# Patient Record
Sex: Female | Born: 1940 | Race: White | Hispanic: No | Marital: Married | Smoking: Never smoker
Health system: Southern US, Community
[De-identification: ages and names within clinical notes are randomized; demographics above are authoritative.]

## PROBLEM LIST (undated history)

## (undated) DIAGNOSIS — C4490 Unspecified malignant neoplasm of skin, unspecified: Secondary | ICD-10-CM

## (undated) DIAGNOSIS — C50919 Malignant neoplasm of unspecified site of unspecified female breast: Secondary | ICD-10-CM

## (undated) DIAGNOSIS — E079 Disorder of thyroid, unspecified: Secondary | ICD-10-CM

## (undated) DIAGNOSIS — I499 Cardiac arrhythmia, unspecified: Secondary | ICD-10-CM

## (undated) DIAGNOSIS — M1990 Unspecified osteoarthritis, unspecified site: Secondary | ICD-10-CM

## (undated) DIAGNOSIS — E039 Hypothyroidism, unspecified: Secondary | ICD-10-CM

## (undated) DIAGNOSIS — N3281 Overactive bladder: Secondary | ICD-10-CM

## (undated) HISTORY — PX: COLONOSCOPY: SHX174

## (undated) HISTORY — PX: ABDOMINAL HYSTERECTOMY: SHX81

## (undated) HISTORY — DX: Overactive bladder: N32.81

## (undated) HISTORY — PX: APPENDECTOMY: SHX54

## (undated) HISTORY — DX: Disorder of thyroid, unspecified: E07.9

## (undated) HISTORY — DX: Malignant neoplasm of unspecified site of unspecified female breast: C50.919

## (undated) HISTORY — DX: Unspecified malignant neoplasm of skin, unspecified: C44.90

## (undated) HISTORY — PX: BREAST SURGERY: SHX581

## (undated) HISTORY — PX: BREAST EXCISIONAL BIOPSY: SUR124

## (undated) HISTORY — PX: CATARACT EXTRACTION W/ INTRAOCULAR LENS IMPLANT: SHX1309

---
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
## 1971-04-14 HISTORY — PX: ABDOMINAL HYSTERECTOMY: SHX81

## 2005-07-28 ENCOUNTER — Other Ambulatory Visit: Admission: RE | Admit: 2005-07-28 | Discharge: 2005-07-28 | Payer: Self-pay | Admitting: Obstetrics and Gynecology

## 2007-10-03 ENCOUNTER — Encounter: Admission: RE | Admit: 2007-10-03 | Discharge: 2007-10-03 | Payer: Self-pay | Admitting: Obstetrics & Gynecology

## 2010-05-04 ENCOUNTER — Encounter: Payer: Self-pay | Admitting: Obstetrics & Gynecology

## 2017-05-22 ENCOUNTER — Encounter: Payer: Self-pay | Admitting: Emergency Medicine

## 2017-05-22 ENCOUNTER — Emergency Department
Admission: EM | Admit: 2017-05-22 | Discharge: 2017-05-22 | Disposition: A | Discharge status: Home / Self Care | Payer: Medicare HMO | Attending: Emergency Medicine | Admitting: Emergency Medicine

## 2017-05-22 ENCOUNTER — Emergency Department: Payer: Medicare HMO

## 2017-05-22 ENCOUNTER — Other Ambulatory Visit: Payer: Self-pay

## 2017-05-22 DIAGNOSIS — M5126 Other intervertebral disc displacement, lumbar region: Secondary | ICD-10-CM | POA: Diagnosis not present

## 2017-05-22 DIAGNOSIS — R319 Hematuria, unspecified: Secondary | ICD-10-CM | POA: Diagnosis not present

## 2017-05-22 DIAGNOSIS — M545 Low back pain, unspecified: Secondary | ICD-10-CM

## 2017-05-22 DIAGNOSIS — R1032 Left lower quadrant pain: Secondary | ICD-10-CM | POA: Diagnosis present

## 2017-05-22 LAB — URINALYSIS, COMPLETE (UACMP) WITH MICROSCOPIC
BILIRUBIN URINE: NEGATIVE
Bacteria, UA: NONE SEEN
Glucose, UA: NEGATIVE mg/dL
Hgb urine dipstick: NEGATIVE
KETONES UR: NEGATIVE mg/dL
LEUKOCYTES UA: NEGATIVE
NITRITE: NEGATIVE
PH: 6 (ref 5.0–8.0)
Protein, ur: NEGATIVE mg/dL
SPECIFIC GRAVITY, URINE: 1.019 (ref 1.005–1.030)

## 2017-05-22 MED ORDER — HYDROCODONE-ACETAMINOPHEN 5-325 MG PO TABS
1.0000 | ORAL_TABLET | Freq: Once | ORAL | Status: AC
  Administered 2017-05-22: 1 via ORAL
  Filled 2017-05-22: qty 1

## 2017-05-22 MED ORDER — HYDROCODONE-ACETAMINOPHEN 5-325 MG PO TABS: ORAL_TABLET | ORAL | 0 refills | Status: DC

## 2017-05-22 NOTE — Discharge Instructions (Signed)
Follow-up with Dr. Joice LoftsPoggi who is in the orthopedic department of [facility].  You will need to call on Monday to make an appointment.  Take Norco every 6-8 hours as needed for pain.  Do not take this medication and plan to do a lot of walking as this could increase her risk for falling.  You may also use ice or heat to your back to see if this helps with symptomatic back pain. No lifting, pushing, pulling until you are pain-free.

## 2017-05-22 NOTE — ED Triage Notes (Addendum)
Pt to ed with c/o back pain x 2 weeks.  States she has been helping her mother move.  Denies fall,  Reports pain worse with movement and twisting.

## 2017-05-22 NOTE — ED Provider Notes (Signed)
[facility] Emergency Department Provider Note   ____________________________________________   First MD Initiated Contact with Patient 05/22/17 [phone]     (approximate)  I have reviewed the triage vital signs and the nursing notes.   HISTORY  Chief Complaint Back Pain   HPI Erica Donaldson is a 77 y.o. female is here with complaint of back pain.  Patient states that she has been having back pain for 2 weeks.  Patient states that she has been traveling to [place] to help her mother move and has been doing a lot of lifting.  Pain is increased with any bending or twisting.  She has been taking Tylenol infrequently.  She states that initially she went to the chiropractor who "adjusted her" and pain was relieved for a period of time.  She denies any paresthesias or radiation into her legs.  There is been no incontinence of bowel or bladder.  Patient is continued to ambulate without any assistance.  She denies any previous back problems.  She rates her pain as a 9/10.  History reviewed. No pertinent past medical history.  There are no active problems to display for this patient.   History reviewed. No pertinent surgical history.  Prior to Admission medications   Medication Sig Start Date End Date Taking? Authorizing Provider  HYDROcodone-acetaminophen (NORCO/VICODIN) 5-325 MG tablet 1 tablet every 6-8 hours as needed for pain. 05/22/17   Tommi Rumps, PA-C    Allergies Patient has no known allergies.  History reviewed. No pertinent family history.  Social History Social History   Tobacco Use  . Smoking status: Never Smoker  . Smokeless tobacco: Never Used  Substance Use Topics  . Alcohol use: No    Frequency: Never  . Drug use: No    Review of Systems Constitutional: No fever/chills Eyes: No visual changes. Cardiovascular: Denies chest pain. Respiratory: Denies shortness of breath. Gastrointestinal: No abdominal pain.  No nausea, no  vomiting.  Genitourinary: Negative for dysuria. Musculoskeletal: Positive for back pain. Skin: Negative for rash. Neurological: Negative for headaches, focal weakness or numbness. ____________________________________________   PHYSICAL EXAM:  VITAL SIGNS: ED Triage Vitals  Enc Vitals Group     BP 05/22/17 0914 127/80     Pulse Rate 05/22/17 0914 77     Resp 05/22/17 0914 18     Temp 05/22/17 0914 (!) 97.5 F (36.4 C)     Temp Source 05/22/17 0914 Oral     SpO2 05/22/17 0914 100 %     Weight 05/22/17 0915 131 lb (59.4 kg)     Height --      Head Circumference --      Peak Flow --      Pain Score 05/22/17 0915 9     Pain Loc --      Pain Edu? --      Excl. in GC? --    Constitutional: Alert and oriented. Well appearing and in no acute distress. Eyes: Conjunctivae are normal.  Head: Atraumatic. Nose: No congestion/rhinnorhea. Mouth/Throat: Mucous membranes are moist.  Oropharynx non-erythematous. Neck: No stridor.   Cardiovascular: Normal rate, regular rhythm. Grossly normal heart sounds.  Good peripheral circulation. Respiratory: Normal respiratory effort.  No retractions. Lungs CTAB. Gastrointestinal: Soft and nontender. No distention.  No CVA tenderness.  Musculoskeletal: Examination of the back there is no gross deformity.  There is tenderness to palpation in the soft tissues lumbar spine on the left and surrounding the SI joint on the left.  Nontender  to palpation on the right.  There is no point tenderness on palpation of the lumbar spine.  Range of motion is difficult and patient is unable to sit for periods of time due to pain.  Straight leg raises were negative. Neurologic:  Normal speech and language. No gross focal neurologic deficits are appreciated.  Reflexes were 1+ bilaterally.  No gait instability. Skin:  Skin is warm, dry and intact. No rash noted. Psychiatric: Mood and affect are normal. Speech and behavior are  normal.  ____________________________________________   LABS (all labs ordered are listed, but only abnormal results are displayed)  Labs Reviewed  URINALYSIS, COMPLETE (UACMP) WITH MICROSCOPIC - Abnormal; Notable for the following components:      Result Value   Color, Urine YELLOW (*)    APPearance CLEAR (*)    Squamous Epithelial / LPF 0-5 (*)    All other components within normal limits    RADIOLOGY  ED MD interpretation:   No compression fractures are appreciated on lumbar spine.  Official radiology report(s): Dg Lumbar Spine 2-3 Views  Result Date: 05/22/2017 CLINICAL DATA:  Low back pain for 2 weeks EXAM: LUMBAR SPINE - 2-3 VIEW COMPARISON:  None. FINDINGS: Normal alignment. No fracture. SI joints are symmetric and unremarkable. Slight disc space narrowing at L1-2. IMPRESSION: No acute bony abnormality. Electronically Signed   By: Charlett NoseKevin  Dover M.D.   On: 05/22/2017 10:30   Ct Renal Stone Study  Result Date: 05/22/2017 CLINICAL DATA:  Low back pain, flank pain EXAM: CT ABDOMEN AND PELVIS WITHOUT CONTRAST TECHNIQUE: Multidetector CT imaging of the abdomen and pelvis was performed following the standard protocol without IV contrast. COMPARISON:  None. FINDINGS: Lower chest: Lung bases are clear. No effusions. Heart is normal size. Hepatobiliary: No focal hepatic abnormality. Gallbladder unremarkable. Pancreas: No focal abnormality or ductal dilatation. Spleen: No focal abnormality.  Normal size. Adrenals/Urinary Tract: No adrenal abnormality. No focal renal abnormality. No stones or hydronephrosis. Urinary bladder is unremarkable. Stomach/Bowel: Moderate stool burden throughout the colon. Stomach, large and small bowel grossly unremarkable. Vascular/Lymphatic: No evidence of aortic aneurysm. There is haziness noted in the retroperitoneum posterior to the aorta anterior to the L1-2 and L2-3 disc spaces. This does not surround the aorta. No visible adenopathy. Reproductive: Prior  hysterectomy.  No adnexal masses. Other: No free fluid or free air. Musculoskeletal: No acute bony abnormality. On the lateral view, in the area of retroperitoneal stranding. There is suggestion of anterior disc herniations at L1-2 and L2-3. IMPRESSION: No renal or ureteral stones.  No hydronephrosis. Moderate stool burden. Haziness in the retroperitoneum posterior to the aorta and anterior to the upper to mid lumbar spine. On sagittal imaging, there is suggestion of possible anterior disc herniations at L1-2 and L2-3. This haziness in the retroperitoneum could reflect fibrosis or be related to inflammation from disc disease. Recommend further evaluation with lumbar spine MRI. Electronically Signed   By: Charlett NoseKevin  Dover M.D.   On: 05/22/2017 11:39    ____________________________________________   PROCEDURES  Procedure(s) performed: None  Procedures  Critical Care performed: No  ____________________________________________   INITIAL IMPRESSION / ASSESSMENT AND PLAN / ED COURSE ----------------------------------------- 12:00 PM on 05/22/2017 ----------------------------------------- Patient was given Norco while in the department and states that pain medication has helped greatly.  Patient has hematuria without symptoms.  She has not had any kidney stones in the past but has had a history of UTIs.  After a negative lumbar spine x-ray and hematuria present a CT renal study was  performed.  No stones were noted however patient did have most likely an anterior disc herniation at L1 and L2 and at L2-L3.  Patient is comfortable at this time and has no paresthesias.  Patient was discharged with a prescription for Norco as needed for pain.  She was also referred to Dr. Joice Lofts as there is no one on-call for neurosurgery today.  Patient is aware that she may be referred to a different doctor what she calls to make the appointment.  ____________________________________________   FINAL CLINICAL  IMPRESSION(S) / ED DIAGNOSES  Final diagnoses:  Herniation of lumbar intervertebral disc  Acute left-sided low back pain without sciatica  Hematuria, unspecified type     ED Discharge Orders        Ordered    HYDROcodone-acetaminophen (NORCO/VICODIN) 5-325 MG tablet     05/22/17 1204       Note:  This document was prepared using Dragon voice recognition software and may include unintentional dictation errors.    Tommi Rumps, PA-C 05/22/17 1258    Jeanmarie Plant, MD 05/22/17 [phone]

## 2017-05-22 NOTE — ED Notes (Signed)
Pt states about 2 weeks ago she began to feel pain in the middle of her lower back, went to the chiropractor and felt some better but now continued pain, Worse on the left side and into her hip, ambulatory without assistance

## 2018-05-02 ENCOUNTER — Encounter: Payer: Self-pay | Admitting: Obstetrics & Gynecology

## 2018-05-03 ENCOUNTER — Ambulatory Visit (INDEPENDENT_AMBULATORY_CARE_PROVIDER_SITE_OTHER): Payer: Medicare HMO | Admitting: Obstetrics & Gynecology

## 2018-05-03 ENCOUNTER — Encounter: Payer: Self-pay | Admitting: Obstetrics & Gynecology

## 2018-05-03 VITALS — BP 120/80 | Ht 63.0 in | Wt 132.0 lb

## 2018-05-03 DIAGNOSIS — Z90710 Acquired absence of both cervix and uterus: Secondary | ICD-10-CM

## 2018-05-03 DIAGNOSIS — N393 Stress incontinence (female) (male): Secondary | ICD-10-CM | POA: Insufficient documentation

## 2018-05-03 DIAGNOSIS — N9419 Other specified dyspareunia: Secondary | ICD-10-CM | POA: Insufficient documentation

## 2018-05-03 DIAGNOSIS — Z01419 Encounter for gynecological examination (general) (routine) without abnormal findings: Secondary | ICD-10-CM

## 2018-05-03 DIAGNOSIS — N952 Postmenopausal atrophic vaginitis: Secondary | ICD-10-CM | POA: Insufficient documentation

## 2018-05-03 MED ORDER — REPLENS VA GEL: 1.0000 | VAGINAL | 11 refills | Status: DC

## 2018-05-03 NOTE — Progress Notes (Signed)
HPI:      [name] is a 78 y.o. G1P0 who LMP was in the past, she presents today for her annual examination- [place] from [place], [place].  Remarried 2 years ago (widow before).  The patient has no complaints today. The patient is sexually active, and reports some BURNING DRYNESS AND DYSPAREUNIA AT TIMES. Herlast pap: approximate date (years ago, prior hysterectomy) and was normal and last mammogram: approximate date 04/2018 and was normal.  The patient does perform self breast exams.  There is no notable family history of breast or ovarian cancer in her family. The patient is taking hormone replacement therapy- BIOIDENTICAL ERT and TESTOSTERONE, for 20 YEARS. Patient denies post-menopausal vaginal bleeding.   The patient has regular exercise: yes. The patient denies current symptoms of depression.    Pt has LOU w stress activities, denies nocturia, urge, and frequency. Prior meds and Botox to bladder no help.  GYN Hx: Last Colonoscopy:10 years ago. Normal.  Last DEXA: never ago.    PMHx: Past Medical History:  Diagnosis Date  . Thyroid disease    Past Surgical History:  Procedure Laterality Date  . ABDOMINAL HYSTERECTOMY    . APPENDECTOMY    . BREAST SURGERY     Family History  Problem Relation Age of Onset  . Cancer Brother   . Lung cancer Brother   . Cancer Maternal Aunt   . Breast cancer Maternal Aunt   . Cancer Paternal Grandmother   . Uterine cancer Paternal Grandmother    Social History   Tobacco Use  . Smoking status: Never Smoker  . Smokeless tobacco: Never Used  Substance Use Topics  . Alcohol use: No    Frequency: Never  . Drug use: No    Current Outpatient Medications:  .  atorvastatin (LIPITOR) 10 MG tablet, , Disp: , Rfl:  .  levothyroxine (SYNTHROID) 25 MCG tablet, Take by mouth., Disp: , Rfl:  .  metoprolol tartrate (LOPRESSOR) 50 MG tablet, Take by mouth., Disp: , Rfl:  Allergies: Sulfa antibiotics  Review of Systems    Constitutional: Negative for chills, fever and malaise/fatigue.  HENT: Negative for congestion, sinus pain and sore throat.   Eyes: Negative for blurred vision and pain.  Respiratory: Negative for cough and wheezing.   Cardiovascular: Negative for chest pain and leg swelling.  Gastrointestinal: Negative for abdominal pain, constipation, diarrhea, heartburn, nausea and vomiting.  Genitourinary: Negative for dysuria, frequency, hematuria and urgency.  Musculoskeletal: Negative for back pain, joint pain, myalgias and neck pain.  Skin: Negative for itching and rash.  Neurological: Negative for dizziness, tremors and weakness.  Endo/Heme/Allergies: Does not bruise/bleed easily.  Psychiatric/Behavioral: Negative for depression. The patient is not nervous/anxious and does not have insomnia.    Objective: BP 120/80   Ht 5\' 3"  (1.6 m)   Wt 132 lb (59.9 kg)   BMI 23.38 kg/m   Filed Weights   05/03/18 1101  Weight: 132 lb (59.9 kg)   Body mass index is 23.38 kg/m. Physical Exam Constitutional:      General: She is not in acute distress.    Appearance: She is well-developed.  Genitourinary:     Pelvic exam was performed with patient supine.     Vagina and rectum normal.     No lesions in the vagina.     No vaginal bleeding.     No right or left adnexal mass present.     Right adnexa not tender.  Left adnexa not tender.     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 No laceration.     Right Ear: Hearing normal.     Left Ear: Hearing normal.     Mouth/Throat:     Pharynx: Uvula midline.  Eyes:     Pupils: Pupils are equal, round, and reactive to light.  Neck:     Musculoskeletal: Normal range of motion and neck supple.     Thyroid: No thyromegaly.  Cardiovascular:     Rate and Rhythm: Normal rate and regular rhythm.     Heart  sounds: No murmur. No friction rub. No gallop.   Pulmonary:     Effort: Pulmonary effort is normal. No respiratory distress.     Breath sounds: Normal breath sounds. No wheezing.  Chest:     Breasts:        Right: No mass, skin change or tenderness.        Left: No mass, skin change or tenderness.  Abdominal:     General: Bowel sounds are normal. There is no distension.     Palpations: Abdomen is soft.     Tenderness: There is no abdominal tenderness. There is no rebound.  Musculoskeletal: Normal range of motion.  Neurological:     Mental Status: She is alert and oriented to person, place, and time.     Cranial Nerves: No cranial nerve deficit.  Skin:    General: Skin is warm and dry.  Psychiatric:        Judgment: Judgment normal.  Vitals signs reviewed.   Assessment: Annual Exam 1. History of hysterectomy   2. Women's annual routine gynecological examination   3. 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 Vaginal atrophy   5. 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  Cervical Screening-  Pap smear not performed, no longer required  2. Breast screening- Exam annually and mammogram scheduled. UTD.  3. Colonoscopy every 10 years although she has been told by PCP she does not require any longer, Hemoccult testing after age 78  4. Labs managed by PCP  5. Counseling for hormonal therapy: no change in therapy today--- she desires to continue with Bio-identical ERT and testosterone.  Will instruct her on [facility] as option to continue compounding regimen.  6. Vaginal atrophy and dyspareunia.  Replens in addition to bio-identical hormones  7. Stress urinary incontincence.  Options for Ant repair (also post repair for mild rectocele) plus/minus sling for GSI discussed.  Pessary alternatives also discussed (she is sexually active and this may affect decision).  She will follow up w Urology as already has appt there in Feb 2020.  Info provided to  patient.     F/U  Return in about 1 year (around 05/04/2019) for Annual.  Annamarie MajorPaul Elizebeth Kluesner, MD, Merlinda FrederickFACOG [facility], [facility] 05/03/2018  11:25 AM

## 2018-05-03 NOTE — Patient Instructions (Addendum)
[facility]    [phone]    Delivery available    [address], [place], [place]  Atrophic Vaginitis  Atrophic vaginitis is a condition in which the tissues that line the vagina become dry and thin. This condition is most common in women who have stopped having regular menstrual periods (are in menopause). This usually starts when a woman is 4345-77 years old. That is the time when a woman's estrogen levels begin to drop (decrease). Estrogen is a female hormone. It helps to keep the tissues of the vagina moist. It stimulates the vagina to produce a clear fluid that lubricates the vagina for sexual intercourse. This fluid also protects the vagina from infection. Lack of estrogen can cause the lining of the vagina to get thinner and dryer. The vagina may also shrink in size. It may become less elastic. Atrophic vaginitis tends to get worse over time as a woman's estrogen level drops. What are the causes? This condition is caused by the normal drop in estrogen that happens around the time of menopause. What increases the risk? Certain conditions or situations may lower a woman's estrogen level, leading to a higher risk for atrophic vaginitis. You are more likely to develop this condition if:  You are taking medicines that block estrogen.  You have had your ovaries removed.  You are being treated for cancer with X-ray (radiation) or medicines (chemotherapy).  You have given birth or are breastfeeding.  You are older than age 77.  You smoke. What are the signs or symptoms? Symptoms of this condition include:  Pain, soreness, or bleeding during sexual intercourse (dyspareunia).  Vaginal burning, irritation, or itching.  Pain or bleeding when a speculum is used in a vaginal exam (pelvic exam).  Having burning pain when passing urine.  Vaginal discharge that is brown or yellow. In some cases, there are no symptoms. How is this diagnosed? This condition is diagnosed by taking a  medical history and doing a physical exam. This will include a pelvic exam that checks the vaginal tissues. Though rare, you may also have other tests, including:  A urine test.  A test that checks the acid balance in your vagina (acid balance test). How is this treated? Treatment for this condition depends on how severe your symptoms are. Treatment may include:  Using an over-the-counter vaginal lubricant before sex.  Using a long-acting vaginal moisturizer. - - - REPLENS twice weekly  Using low-dose vaginal estrogen for moderate to severe symptoms that do not respond to other treatments. Options include creams, tablets, and inserts (vaginal rings). Before you use a vaginal estrogen, tell your health care provider if you have a history of: ? Breast cancer. ? Endometrial cancer. ? Blood clots. If you are not sexually active and your symptoms are very mild, you may not need treatment. Follow these instructions at home: Medicines  Take over-the-counter and prescription medicines only as told by your health care provider. Do not use herbal or alternative medicines unless your health care provider says that you can.  Use over-the-counter creams, lubricants, or moisturizers for dryness only as directed by your health care provider. General instructions  If your atrophic vaginitis is caused by menopause, discuss all of your menopause symptoms and treatment options with your health care provider.  Do not douche.  Do not use products that can make your vagina dry. These include: ? Scented feminine sprays. ? Scented tampons. ? Scented soaps.  Vaginal intercourse can help to improve blood flow  and elasticity of vaginal tissue. If it hurts to have sex, try using a lubricant or moisturizer just before having intercourse. Contact a health care provider if:  Your discharge looks different than normal.  Your vagina has an unusual smell.  You have new symptoms.  Your symptoms do not  improve with treatment.  Your symptoms get worse. Summary  Atrophic vaginitis is a condition in which the tissues that line the vagina become dry and thin. It is most common in women who have stopped having regular menstrual periods (are in menopause).  Treatment options include using vaginal lubricants and low-dose vaginal estrogen.  Contact a health care provider if your vagina has an unusual smell, or if your symptoms get worse or do not improve after treatment. This information is not intended to replace advice given to you by your health care provider. Make sure you discuss any questions you have with your health care provider. Document Released: 08/14/2014 Document Revised: 12/24/2016 Document Reviewed: 12/24/2016 Elsevier Interactive Patient Education  2019 [employer].

## 2018-05-12 ENCOUNTER — Telehealth: Payer: Self-pay | Admitting: Obstetrics & Gynecology

## 2018-05-12 ENCOUNTER — Other Ambulatory Visit: Payer: Self-pay

## 2018-05-12 MED ORDER — REPLENS VA GEL: 1.0000 | VAGINAL | 11 refills | Status: DC

## 2018-05-12 NOTE — Telephone Encounter (Signed)
Pt came in stating new rx needs to be sent to [facility] like discussed at last appt. Thank you

## 2018-05-12 NOTE — Telephone Encounter (Signed)
Added pharmacy and sent Replens to new pharmacy

## 2018-11-17 ENCOUNTER — Other Ambulatory Visit: Payer: Self-pay | Admitting: Obstetrics & Gynecology

## 2019-02-28 ENCOUNTER — Other Ambulatory Visit: Payer: Self-pay | Admitting: Family Medicine

## 2019-02-28 DIAGNOSIS — R1011 Right upper quadrant pain: Secondary | ICD-10-CM

## 2019-03-06 ENCOUNTER — Ambulatory Visit
Admission: RE | Admit: 2019-03-06 | Discharge: 2019-03-06 | Disposition: A | Discharge status: Home / Self Care | Payer: Medicare HMO | Source: Ambulatory Visit | Attending: Family Medicine | Admitting: Family Medicine

## 2019-03-06 ENCOUNTER — Other Ambulatory Visit: Payer: Self-pay

## 2019-03-06 DIAGNOSIS — R1011 Right upper quadrant pain: Secondary | ICD-10-CM

## 2019-03-16 ENCOUNTER — Other Ambulatory Visit (HOSPITAL_COMMUNITY): Payer: Self-pay | Admitting: Gastroenterology

## 2019-03-16 ENCOUNTER — Other Ambulatory Visit: Payer: Self-pay | Admitting: Gastroenterology

## 2019-03-16 DIAGNOSIS — R110 Nausea: Secondary | ICD-10-CM

## 2019-03-16 DIAGNOSIS — R1011 Right upper quadrant pain: Secondary | ICD-10-CM

## 2019-03-16 DIAGNOSIS — R1013 Epigastric pain: Secondary | ICD-10-CM

## 2019-03-27 ENCOUNTER — Other Ambulatory Visit: Payer: Self-pay

## 2019-03-27 ENCOUNTER — Ambulatory Visit
Admission: RE | Admit: 2019-03-27 | Discharge: 2019-03-27 | Disposition: A | Discharge status: Home / Self Care | Payer: Medicare HMO | Source: Ambulatory Visit | Attending: Gastroenterology | Admitting: Gastroenterology

## 2019-03-27 DIAGNOSIS — R1013 Epigastric pain: Secondary | ICD-10-CM | POA: Diagnosis present

## 2019-03-27 DIAGNOSIS — R1011 Right upper quadrant pain: Secondary | ICD-10-CM | POA: Insufficient documentation

## 2019-03-27 DIAGNOSIS — R110 Nausea: Secondary | ICD-10-CM | POA: Diagnosis present

## 2019-03-27 MED ORDER — TECHNETIUM TC 99M MEBROFENIN IV KIT
5.3800 | PACK | Freq: Once | INTRAVENOUS | Status: AC | PRN
  Administered 2019-03-27: 5.38 via INTRAVENOUS

## 2019-04-24 ENCOUNTER — Other Ambulatory Visit: Payer: Self-pay | Admitting: Obstetrics & Gynecology

## 2019-04-24 ENCOUNTER — Telehealth: Payer: Self-pay

## 2019-04-24 DIAGNOSIS — Z1231 Encounter for screening mammogram for malignant neoplasm of breast: Secondary | ICD-10-CM

## 2019-04-24 DIAGNOSIS — Z1239 Encounter for other screening for malignant neoplasm of breast: Secondary | ICD-10-CM

## 2019-04-24 NOTE — Telephone Encounter (Signed)
MMG order placed, she can call to make appt

## 2019-04-24 NOTE — Telephone Encounter (Signed)
Pt was mistaken, she is wanting [place] to call a mammogram office in [place] and give them [facility] info so they can send over the results after her mammo. I will call and give info. They will send results over after her appt there 2/10

## 2019-04-24 NOTE — Telephone Encounter (Signed)
Pt calling triage today requesting order for her mammo so she can get it when she comes for her annual. She lives out of town so wants to get it all done the same day. Please advise. They will not let her schedule without an order since she has not been seen lately.

## 2019-05-08 ENCOUNTER — Encounter: Payer: Self-pay | Admitting: Obstetrics & Gynecology

## 2019-05-08 ENCOUNTER — Ambulatory Visit (INDEPENDENT_AMBULATORY_CARE_PROVIDER_SITE_OTHER): Payer: Medicare HMO | Admitting: Obstetrics & Gynecology

## 2019-05-08 ENCOUNTER — Other Ambulatory Visit: Payer: Self-pay

## 2019-05-08 VITALS — BP 120/80 | Ht 63.0 in | Wt 139.0 lb

## 2019-05-08 DIAGNOSIS — N3281 Overactive bladder: Secondary | ICD-10-CM | POA: Insufficient documentation

## 2019-05-08 DIAGNOSIS — Z01419 Encounter for gynecological examination (general) (routine) without abnormal findings: Secondary | ICD-10-CM | POA: Diagnosis not present

## 2019-05-08 DIAGNOSIS — N952 Postmenopausal atrophic vaginitis: Secondary | ICD-10-CM

## 2019-05-08 DIAGNOSIS — Z90710 Acquired absence of both cervix and uterus: Secondary | ICD-10-CM

## 2019-05-08 MED ORDER — REPLENS VA GEL: 1.0000 | VAGINAL | 11 refills | Status: DC

## 2019-05-08 NOTE — Progress Notes (Signed)
HPI:      [name] is a 79 y.o. G1P0 who LMP was in the past, she presents today for her annual examination.  The patient has complaints today of continued vaginal dryness that Replens helps with and also unresolved urinary concerns with incontinence especially related to frequency and urgency; she was unable to see urologist last year;  Has had prior testing, pills, and Botox without relief. The patient is not currently sexually active. Herlast pap: was normal and she is not due as has had prior hysterectomy and last mammogram: approximate date 2019 and was normal.  The patient does perform self breast exams.  There is no notable family history of breast or ovarian cancer in her family. The patient is taking hormone replacement therapy. Patient denies post-menopausal vaginal bleeding.   The patient has regular exercise: yes. The patient denies current symptoms of depression.      PMHx: Past Medical History:  Diagnosis Date  . Thyroid disease    Past Surgical History:  Procedure Laterality Date  . ABDOMINAL HYSTERECTOMY    . APPENDECTOMY    . BREAST SURGERY     Family History  Problem Relation Age of Onset  . Cancer Brother   . Lung cancer Brother   . Cancer Maternal Aunt   . Breast cancer Maternal Aunt   . Cancer Paternal Grandmother   . Uterine cancer Paternal Grandmother    Social History   Tobacco Use  . Smoking status: Never Smoker  . Smokeless tobacco: Never Used  Substance Use Topics  . Alcohol use: No  . Drug use: No    Current Outpatient Medications:  .  atorvastatin (LIPITOR) 10 MG tablet, , Disp: , Rfl:  .  levothyroxine (SYNTHROID) 25 MCG tablet, Take by mouth., Disp: , Rfl:  .  metoprolol tartrate (LOPRESSOR) 50 MG tablet, Take by mouth., Disp: , Rfl:  .  Vaginal Lubricant (REPLENS) GEL, Place 1 Applicatorful vaginally 2 (two) times a week., Disp: 35 g, Rfl: 11 Allergies: Sulfa antibiotics  Review of Systems  Constitutional: Negative for chills,  fever and malaise/fatigue.  HENT: Negative for congestion, sinus pain and sore throat.   Eyes: Negative for blurred vision and pain.  Respiratory: Negative for cough and wheezing.   Cardiovascular: Negative for chest pain and leg swelling.  Gastrointestinal: Negative for abdominal pain, constipation, diarrhea, heartburn, nausea and vomiting.  Genitourinary: Negative for dysuria, frequency, hematuria and urgency.  Musculoskeletal: Negative for back pain, joint pain, myalgias and neck pain.  Skin: Negative for itching and rash.  Neurological: Negative for dizziness, tremors and weakness.  Endo/Heme/Allergies: Does not bruise/bleed easily.  Psychiatric/Behavioral: Negative for depression. The patient is not nervous/anxious and does not have insomnia.     Objective: BP 120/80   Ht 5\' 3"  (1.6 m)   Wt 139 lb (63 kg)   BMI 24.62 kg/m   Filed Weights   05/08/19 0958  Weight: 139 lb (63 kg)   Body mass index is 24.62 kg/m. Physical Exam Constitutional:      General: She is not in acute distress.    Appearance: She is well-developed.  Genitourinary:     Pelvic exam was performed with patient supine.     Vagina and rectum normal.     No lesions in the vagina.     No vaginal bleeding.     No right or left adnexal mass present.     Right adnexa not tender.     Left adnexa not tender.  Genitourinary Comments: Absent Uterus Absent cervix Vaginal cuff well healed  HENT:     Head: Normocephalic and atraumatic. No laceration.     Right Ear: Hearing normal.     Left Ear: Hearing normal.     Mouth/Throat:     Pharynx: Uvula midline.  Eyes:     Pupils: Pupils are equal, round, and reactive to light.  Neck:     Thyroid: No thyromegaly.  Cardiovascular:     Rate and Rhythm: Normal rate and regular rhythm.     Heart sounds: No murmur. No friction rub. No gallop.   Pulmonary:     Effort: Pulmonary effort is normal. No respiratory distress.     Breath sounds: Normal breath sounds. No  wheezing.  Chest:     Breasts:        Right: No mass, skin change or tenderness.        Left: No mass, skin change or tenderness.  Abdominal:     General: Bowel sounds are normal. There is no distension.     Palpations: Abdomen is soft.     Tenderness: There is no abdominal tenderness. There is no rebound.  Musculoskeletal:        General: Normal range of motion.     Cervical back: Normal range of motion and neck supple.  Neurological:     Mental Status: She is alert and oriented to person, place, and time.     Cranial Nerves: No cranial nerve deficit.  Skin:    General: Skin is warm and dry.  Psychiatric:        Judgment: Judgment normal.  Vitals reviewed.     Assessment: Annual Exam 1. Women's annual routine gynecological examination   2. History of hysterectomy   3. Detrusor instability of bladder   4. Vaginal atrophy     Plan:            1.  Cervical Screening-  Pap smear schedule reviewed with patient, Pap smear not performed  2. Breast screening- Exam annually and mammogram scheduled  3. Colonoscopy every 10 years, Hemoccult testing after age 60  4. Labs managed by PCP  5. Counseling for hormonal therapy: no change in therapy today              6. FRAX - FRAX score for assessing the 10 year probability for fracture calculated and discussed today.  Based on age and score today, DEXA is not currently scheduled.   7. Urinary incontinence, referral to urology   8. Vag atrophy, cont Replens   9. Bioidentical HRT, pros and cons again discussed at her age, prefers to continue.  Sees [facility] for distribution.    F/U  Return in about 1 year (around 05/07/2020) for Annual.  Annamarie Major, MD, [facility], [facility] 05/08/2019  10:35 AM

## 2019-06-07 ENCOUNTER — Ambulatory Visit (INDEPENDENT_AMBULATORY_CARE_PROVIDER_SITE_OTHER): Payer: Medicare HMO | Admitting: Urology

## 2019-06-07 ENCOUNTER — Encounter: Payer: Self-pay | Admitting: Urology

## 2019-06-07 ENCOUNTER — Other Ambulatory Visit: Payer: Self-pay

## 2019-06-07 VITALS — BP 130/78 | HR 86 | Ht 63.0 in | Wt 137.0 lb

## 2019-06-07 DIAGNOSIS — N3281 Overactive bladder: Secondary | ICD-10-CM

## 2019-06-07 DIAGNOSIS — N952 Postmenopausal atrophic vaginitis: Secondary | ICD-10-CM

## 2019-06-07 DIAGNOSIS — R3915 Urgency of urination: Secondary | ICD-10-CM

## 2019-06-07 LAB — MICROSCOPIC EXAMINATION
Bacteria, UA: NONE SEEN
WBC, UA: NONE SEEN /hpf (ref 0–5)

## 2019-06-07 LAB — URINALYSIS, COMPLETE
Bilirubin, UA: NEGATIVE
Ketones, UA: NEGATIVE
Leukocytes,UA: NEGATIVE
Nitrite, UA: NEGATIVE
Protein,UA: NEGATIVE
Specific Gravity, UA: 1.025 (ref 1.005–1.030)
Urobilinogen, Ur: 0.2 mg/dL (ref 0.2–1.0)
pH, UA: 5.5 (ref 5.0–7.5)

## 2019-06-07 LAB — BLADDER SCAN AMB NON-IMAGING

## 2019-06-07 NOTE — Progress Notes (Deleted)
06/07/2019 9:17 AM   Erica Donaldson 1941/03/17 [id number]  Referring provider: Marisue Ivan, MD [phone] [facility] [address] [facility] [place],  [place] [address]  No chief complaint on file.   HPI: 79 year old female who presents today for further evaluation of urinary urgency and urge incontinence.  She is previously followed by urologist in [place].  She was receiving Botox injections.  Records have been requested today.  PVR 10 cc   PMH: Past Medical History:  Diagnosis Date  . Thyroid disease     Surgical History: Past Surgical History:  Procedure Laterality Date  . ABDOMINAL HYSTERECTOMY    . APPENDECTOMY    . BREAST SURGERY      Home Medications:  Allergies as of 06/07/2019      Reactions   Sulfa Antibiotics Rash      Medication List       Accurate as of June 07, 2019  9:17 AM. If you have any questions, ask your nurse or doctor.        atorvastatin 10 MG tablet Commonly known as: LIPITOR   metoprolol tartrate 50 MG tablet Commonly known as: LOPRESSOR Take by mouth.   Replens Gel Place 1 Applicatorful vaginally 2 (two) times a week.   Synthroid 25 MCG tablet Generic drug: levothyroxine Take by mouth.       Allergies:  Allergies  Allergen Reactions  . Sulfa Antibiotics Rash    Family History: Family History  Problem Relation Age of Onset  . Cancer Brother   . Lung cancer Brother   . Cancer Maternal Aunt   . Breast cancer Maternal Aunt   . Cancer Paternal Grandmother   . Uterine cancer Paternal Grandmother     Social History:  reports that she has never smoked. She has never used smokeless tobacco. She reports that she does not drink alcohol or use drugs.   Physical Exam: Ht 5\' 3"  (1.6 m)   BMI 24.62 kg/m   Constitutional:  Alert and oriented, No acute distress. HEENT: [place] AT, moist mucus membranes.  Trachea midline, no masses. Cardiovascular: No clubbing, cyanosis, or edema. Respiratory: Normal  respiratory effort, no increased work of breathing. GI: Abdomen is soft, nontender, nondistended, no abdominal masses GU: No CVA tenderness Lymph: No cervical or inguinal lymphadenopathy. Skin: No rashes, bruises or suspicious lesions. Neurologic: Grossly intact, no focal deficits, moving all 4 extremities. Psychiatric: Normal mood and affect.  Laboratory Data: No results found for: WBC, HGB, HCT, MCV, PLT  No results found for: CREATININE  No results found for: PSA  No results found for: TESTOSTERONE  No results found for: HGBA1C  Urinalysis    Component Value Date/Time   COLORURINE YELLOW (A) 05/22/2017 0956   APPEARANCEUR CLEAR (A) 05/22/2017 0956   LABSPEC 1.019 05/22/2017 0956   PHURINE 6.0 05/22/2017 0956   GLUCOSEU NEGATIVE 05/22/2017 0956   HGBUR NEGATIVE 05/22/2017 0956   BILIRUBINUR NEGATIVE 05/22/2017 0956   KETONESUR NEGATIVE 05/22/2017 0956   PROTEINUR NEGATIVE 05/22/2017 0956   NITRITE NEGATIVE 05/22/2017 0956   LEUKOCYTESUR NEGATIVE 05/22/2017 0956    Lab Results  Component Value Date   BACTERIA NONE SEEN 05/22/2017    Pertinent Imaging: *** No results found for this or any previous visit. No results found for this or any previous visit. No results found for this or any previous visit. No results found for this or any previous visit. No results found for this or any previous visit. No results found for this or any previous  visit. No results found for this or any previous visit. Results for orders placed during the hospital encounter of 05/22/17  CT RENAL STONE STUDY   Narrative CLINICAL DATA:  Low back pain, flank pain  EXAM: CT ABDOMEN AND PELVIS WITHOUT CONTRAST  TECHNIQUE: Multidetector CT imaging of the abdomen and pelvis was performed following the standard protocol without IV contrast.  COMPARISON:  None.  FINDINGS: Lower chest: Lung bases are clear. No effusions. Heart is normal size.  Hepatobiliary: No focal hepatic abnormality.  Gallbladder unremarkable.  Pancreas: No focal abnormality or ductal dilatation.  Spleen: No focal abnormality.  Normal size.  Adrenals/Urinary Tract: No adrenal abnormality. No focal renal abnormality. No stones or hydronephrosis. Urinary bladder is unremarkable.  Stomach/Bowel: Moderate stool burden throughout the colon. Stomach, large and small bowel grossly unremarkable.  Vascular/Lymphatic: No evidence of aortic aneurysm. There is haziness noted in the retroperitoneum posterior to the aorta anterior to the L1-2 and L2-3 disc spaces. This does not surround the aorta. No visible adenopathy.  Reproductive: Prior hysterectomy.  No adnexal masses.  Other: No free fluid or free air.  Musculoskeletal: No acute bony abnormality. On the lateral view, in the area of retroperitoneal stranding. There is suggestion of anterior disc herniations at L1-2 and L2-3.  IMPRESSION: No renal or ureteral stones.  No hydronephrosis.  Moderate stool burden.  Haziness in the retroperitoneum posterior to the aorta and anterior to the upper to mid lumbar spine. On sagittal imaging, there is suggestion of possible anterior disc herniations at L1-2 and L2-3. This haziness in the retroperitoneum could reflect fibrosis or be related to inflammation from disc disease. Recommend further evaluation with lumbar spine MRI.   Electronically Signed   By: Rolm Baptise M.D.   On: 05/22/2017 11:39     Assessment & Plan:    1. Detrusor instability of bladder *** - Urinalysis, Complete   No follow-ups on file.  Hollice Espy, MD  [facility] [address], [place], [place] [address] [phone]

## 2019-06-07 NOTE — Patient Instructions (Signed)

## 2019-06-07 NOTE — Progress Notes (Addendum)
06/06/2019 10:20 AM   Lars Masson [birth year] [id number]  Referring provider: Dion Body, MD [place] [facility] [place],  [place] [address]    HPI: 79 year old female who presents today for further evaluation of urinary urgency and urge incontinence.  She was previously followed by a urologist in [place]. She was receiving Botox injections. Records have been requested today.   Pt reports she was seen by Dr. Lamarr Lulas in [facility] 4-5 years ago. She reports of 3-4 pads of leakage during this time and has also tried pills but were ineffective.   She recalls one pill made her have dry mouth / constipation.  She cannot ecall the name of pillls.  Botox was also tried which worked for a few weeks but then recurred quickly.  She did not pursue this further.    She is also seen remotely by OB/GYN in [facility] who recommended physical therapy but she never pursued this.  She is currently under the care of Dr. Kenton Kingfisher for vaginal atrophy.  She started using Replens in the recent past and has had good results with this.  Today, she reports of urination when laughing, coughing, and sneezing. She notes that she goes to the restroom 2-3x per night.   She reports of drinking 2 cups of decaf coffee in the morning and tea once a day either at lunch or dinner.  She also has significant urinary urgency.  When she feels the urge, she needs to get to the bathroom immediately or she will have a large volume incontinence.  No severe frequency.  She does engage in toilet mapping.  She is sexually active. She denies burning, infections, and vagina bulge, problems with constipation. She does report of taking calcium.     Her PVR today was 10 cc.  PMH: Past Medical History:  Diagnosis Date  . Overactive bladder   . Thyroid disease     Surgical History: Past Surgical History:  Procedure Laterality Date  . ABDOMINAL HYSTERECTOMY    . APPENDECTOMY    .  BREAST SURGERY      Home Medications:  Allergies as of 06/07/2019      Reactions   Sulfa Antibiotics Rash      Medication List       Accurate as of June 07, 2019 10:20 AM. If you have any questions, ask your nurse or doctor.        atorvastatin 10 MG tablet Commonly known as: LIPITOR   metoprolol tartrate 50 MG tablet Commonly known as: LOPRESSOR Take by mouth.   Replens Gel Place 1 Applicatorful vaginally 2 (two) times a week.   Synthroid 25 MCG tablet Generic drug: levothyroxine Take by mouth.       Allergies:  Allergies  Allergen Reactions  . Sulfa Antibiotics Rash    Family History: Family History  Problem Relation Age of Onset  . Cancer Brother   . Lung cancer Brother   . Cancer Maternal Aunt   . Breast cancer Maternal Aunt   . Cancer Paternal Grandmother   . Uterine cancer Paternal Grandmother     Social History:  reports that she has never smoked. She has never used smokeless tobacco. She reports that she does not drink alcohol or use drugs.   Physical Exam: BP 130/78   Pulse 86   Ht 5\' 3"  (1.6 m)   Wt 137 lb (62.1 kg)   BMI 24.27 kg/m   Constitutional:  Alert and oriented, No acute  distress. HEENT: [place] AT, moist mucus membranes.  Trachea midline, no masses. Cardiovascular: No clubbing, cyanosis, or edema. Respiratory: Normal respiratory effort, no increased work of breathing. Skin: No rashes, bruises or suspicious lesions. Neurologic: Grossly intact, no focal deficits, moving all 4 extremities. Psychiatric: Normal mood and affect.  Laboratory Data: Urinalysis shows glucose trace. See Epic.   Imaging: Results for orders placed or performed in visit on 06/07/19  Bladder Scan (Post Void Residual) in office  Result Value Ref Range   Scan Result     Assessment & Plan:    1. Mixed urinary incontinence  - Significant bother, chronic, worsening -Lengthy discussion today about the pathophysiology of both urge and stress  incontinence components.  Discussed various treatment options for each.   -adequate emptying, PVR minimal -No evidence of UTI as contributing factor -Rx of Myrbetriq 50 mg given to pt, sample x 4 weeks, discussed possible side effect -Kegal exercises recommended; considering PT referal  -Patient may interested in surgical surgical intervention for her stress incontinence, referred to Dr. Sherron Monday to discuss further -F/u with Dr. Sherron Monday in 4 weeks.   2. Vaginal atrophy -Continue with Replense   Return in about 4 weeks (around 07/05/2019) for MacDiarmid.   [facility] [address] [place], [place] [address] [phone]  I, Donne Hazel, am acting as a scribe for Dr. Vanna [place],  Vanna [place], MD

## 2019-06-16 ENCOUNTER — Telehealth: Payer: Self-pay

## 2019-06-16 ENCOUNTER — Other Ambulatory Visit: Payer: Self-pay | Admitting: Obstetrics & Gynecology

## 2019-06-16 ENCOUNTER — Other Ambulatory Visit: Payer: Self-pay

## 2019-06-16 NOTE — Telephone Encounter (Signed)
I do not see any estrogen on her meds list..

## 2019-06-16 NOTE — Telephone Encounter (Signed)
Pt calling triage stating she needs her estrogen refilled... pt just had annual. Please advise

## 2019-06-16 NOTE — Telephone Encounter (Signed)
She has her meds compounded thru [facility].  Tell to have them send the Rx refill request themselves, as they have the Rx/formula for this, and I will sign off on it.

## 2019-06-16 NOTE — Telephone Encounter (Signed)
Pharm is resending RX request. JP will get it and approve it.

## 2019-07-03 ENCOUNTER — Other Ambulatory Visit: Payer: Self-pay

## 2019-07-03 ENCOUNTER — Encounter: Payer: Self-pay | Admitting: Urology

## 2019-07-03 ENCOUNTER — Ambulatory Visit (INDEPENDENT_AMBULATORY_CARE_PROVIDER_SITE_OTHER): Payer: Medicare HMO | Admitting: Urology

## 2019-07-03 VITALS — BP 124/75 | HR 80 | Ht 63.0 in | Wt 137.0 lb

## 2019-07-03 DIAGNOSIS — N3946 Mixed incontinence: Secondary | ICD-10-CM | POA: Diagnosis not present

## 2019-07-03 NOTE — Progress Notes (Signed)
07/03/2019 9:47 AM   Erica Donaldson Oct 27, 1940 [id number]  Referring provider: Marisue Ivan, MD [phone] [facility] [address] [facility] [place],  [place] [address]  Chief Complaint  Patient presents with  . Follow-up    HPI: Dr [name]: Patient has vaginal atrophy and is treated with Replens.  Physical therapy has been offered in the past.  She has had Botox for urge incontinence elsewhere.  Was given Myrbetriq and had a normal residual  Today Patient leaks with coughing sneezing bending and lifting.  She leaks when she walks.  She has no bedwetting.  If she waits too long she can have urge incontinence but otherwise does not.  She can soak 3 pads a day  Her flow was good.  She gets up once or twice at night and voids every 2 hours during the day.  She had 1 Botox treatment and that helped her for 6 weeks and this was 4 years ago.  It appears she is tried to antimuscarinics but could not remember the names for certain and had side effects and lack of efficacy  Denies history of kidney stones previous to surgery and bladder infections.  No hysterectomy.  No neurologic issues   PMH: Past Medical History:  Diagnosis Date  . Overactive bladder   . Thyroid disease     Surgical History: Past Surgical History:  Procedure Laterality Date  . ABDOMINAL HYSTERECTOMY    . APPENDECTOMY    . BREAST SURGERY      Home Medications:  Allergies as of 07/03/2019      Reactions   Sulfa Antibiotics Rash      Medication List       Accurate as of July 03, 2019  9:47 AM. If you have any questions, ask your nurse or doctor.        atorvastatin 10 MG tablet Commonly known as: LIPITOR   Hormone Cream Base Crea Apply 1 mL topically twice daily   metoprolol tartrate 50 MG tablet Commonly known as: LOPRESSOR Take by mouth.   Replens Gel Place 1 Applicatorful vaginally 2 (two) times a week.   Synthroid 25 MCG tablet Generic drug: levothyroxine Take by mouth.        Allergies:  Allergies  Allergen Reactions  . Sulfa Antibiotics Rash    Family History: Family History  Problem Relation Age of Onset  . Cancer Brother   . Lung cancer Brother   . Cancer Maternal Aunt   . Breast cancer Maternal Aunt   . Cancer Paternal Grandmother   . Uterine cancer Paternal Grandmother     Social History:  reports that she has never smoked. She has never used smokeless tobacco. She reports that she does not drink alcohol or use drugs.  ROS:                                        Physical Exam: BP 124/75   Pulse 80   Ht 5\' 3"  (1.6 m)   Wt 62.1 kg   BMI 24.27 kg/m   Constitutional:  Alert and oriented, No acute distress. HEENT: [place] AT, moist mucus membranes.  Trachea midline, no masses. Cardiovascular: No clubbing, cyanosis, or edema. Respiratory: Normal respiratory effort, no increased work of breathing. GI: Abdomen is soft, nontender, nondistended, no abdominal masses GU: Mild grade 2 hypermobility the bladder neck and no stress incontinence.  No prolapse.  Moderate  atrophy Skin: No rashes, bruises or suspicious lesions. Lymph: No cervical or inguinal adenopathy. Neurologic: Grossly intact, no focal deficits, moving all 4 extremities. Psychiatric: Normal mood and affect.  Laboratory Data: No results found for: WBC, HGB, HCT, MCV, PLT  No results found for: CREATININE  No results found for: PSA  No results found for: TESTOSTERONE  No results found for: HGBA1C  Urinalysis    Component Value Date/Time   COLORURINE YELLOW (A) 05/22/2017 0956   APPEARANCEUR Clear 06/07/2019 0914   LABSPEC 1.019 05/22/2017 0956   PHURINE 6.0 05/22/2017 0956   GLUCOSEU Trace (A) 06/07/2019 0914   HGBUR NEGATIVE 05/22/2017 0956   BILIRUBINUR Negative 06/07/2019 0914   KETONESUR NEGATIVE 05/22/2017 0956   PROTEINUR Negative 06/07/2019 0914   PROTEINUR NEGATIVE 05/22/2017 0956   NITRITE Negative 06/07/2019 0914   NITRITE NEGATIVE  05/22/2017 0956   LEUKOCYTESUR Negative 06/07/2019 0914    Pertinent Imaging:   Assessment & Plan: Patient has mixed incontinence but primarily stress incontinence.  The role of urodynamics discussed.  Depending on goals a sling or a bulking agent might be a treatment option for her.  Bulking agent may be excellent based upon physical examination and patient age  There are no diagnoses linked to this encounter.  No follow-ups on file.  Reece Packer, MD  [place] [address] [place], [place] [address] [phone]

## 2019-07-05 ENCOUNTER — Ambulatory Visit: Payer: Self-pay | Admitting: Urology

## 2019-07-28 ENCOUNTER — Other Ambulatory Visit: Payer: Self-pay | Admitting: Urology

## 2019-08-14 ENCOUNTER — Ambulatory Visit (INDEPENDENT_AMBULATORY_CARE_PROVIDER_SITE_OTHER): Payer: Medicare HMO | Admitting: Urology

## 2019-08-14 ENCOUNTER — Other Ambulatory Visit: Payer: Self-pay

## 2019-08-14 ENCOUNTER — Encounter: Payer: Self-pay | Admitting: Urology

## 2019-08-14 VITALS — BP 127/73 | HR 80 | Ht 63.0 in

## 2019-08-14 DIAGNOSIS — N3946 Mixed incontinence: Secondary | ICD-10-CM | POA: Diagnosis not present

## 2019-08-14 NOTE — Progress Notes (Signed)
08/14/2019 8:48 AM   Erica Donaldson May 16, 1940 [id number]  Referring provider: Marisue Ivan, MD [phone] [facility] [address] [facility] [place],  [place] [address]  No chief complaint on file.   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  Physical therapy has been offered in the past.  She has had Botox for urge incontinence elsewhere.  Was given Myrbetriq and had a normal residual  Today Patient leaks with coughing sneezing bending and lifting.  She leaks when she walks.  She has no bedwetting.  If she waits too long she can have urge incontinence but otherwise does not.  She can soak 3 pads a day  Her flow was good.  She gets up once or twice at night and voids every 2 hours during the day.  She had 1 Botox treatment and that helped her for 6 weeks and this was 4 years ago.  It appears she is tried to antimuscarinics but could not remember the names for certain and had side effects and lack of efficacy  Mild grade 2 hypermobility the bladder neck and no stress incontinence.  No prolapse.  Moderate atrophy  Patient has mixed incontinence but primarily stress incontinence.  The role of urodynamics discussed.  Depending on goals a sling or a bulking agent might be a treatment option for her.  Bulking agent may be excellent based upon physical examination and patient age  Today Frequency stable.  Incontinence stable. During urodynamics patient voided 207 mL with a maximum flow 30 mils per second.  She was catheterized to 75 mL.  Maximum bladder capacity was 350 mL.  She had a hypersensitive bladder.  Bladder was stable.  At 200 mL her leak point pressure is 94 cm water with moderate leakage.  At 300 mL her leak point pressure was 67 cm of water with mild leakage.  During voluntary voiding she voided 316 mL with a maximum flow 24 mils per second.  Maximum voiding pressure 9 cm water.  Residual 20 mL.  EMG activity increased during voiding.   Bladder neck descent at 1 or 2 cm.  Contraction was weak but well sustained.  The details of the urodynamics are signed dictated    PMH: Past Medical History:  Diagnosis Date  . Overactive bladder   . Thyroid disease     Surgical History: Past Surgical History:  Procedure Laterality Date  . ABDOMINAL HYSTERECTOMY    . APPENDECTOMY    . BREAST SURGERY      Home Medications:  Allergies as of 08/14/2019      Reactions   Sulfa Antibiotics Rash      Medication List       Accurate as of Aug 14, 2019  8:48 AM. If you have any questions, ask your nurse or doctor.        atorvastatin 10 MG tablet Commonly known as: LIPITOR   Hormone Cream Base Crea Apply 1 mL topically twice daily   metoprolol tartrate 50 MG tablet Commonly known as: LOPRESSOR Take by mouth.   Replens Gel Place 1 Applicatorful vaginally 2 (two) times a week.   Synthroid 25 MCG tablet Generic drug: levothyroxine Take by mouth.       Allergies:  Allergies  Allergen Reactions  . Sulfa Antibiotics Rash    Family History: Family History  Problem Relation Age of Onset  . Cancer Brother   . Lung cancer Brother   . Cancer Maternal Aunt   . Breast  cancer Maternal Aunt   . Cancer Paternal Grandmother   . Uterine cancer Paternal Grandmother     Social History:  reports that she has never smoked. She has never used smokeless tobacco. She reports that she does not drink alcohol or use drugs.  ROS:                                        Physical Exam: There were no vitals taken for this visit.  Constitutional:  Alert and oriented, No acute distress.  Laboratory Data: No results found for: WBC, HGB, HCT, MCV, PLT  No results found for: CREATININE  No results found for: PSA  No results found for: TESTOSTERONE  No results found for: HGBA1C  Urinalysis    Component Value Date/Time   COLORURINE YELLOW (A) 05/22/2017 0956   APPEARANCEUR Clear 06/07/2019 0914    LABSPEC 1.019 05/22/2017 0956   PHURINE 6.0 05/22/2017 0956   GLUCOSEU Trace (A) 06/07/2019 0914   HGBUR NEGATIVE 05/22/2017 0956   BILIRUBINUR Negative 06/07/2019 0914   KETONESUR NEGATIVE 05/22/2017 0956   PROTEINUR Negative 06/07/2019 0914   PROTEINUR NEGATIVE 05/22/2017 0956   NITRITE Negative 06/07/2019 0914   NITRITE NEGATIVE 05/22/2017 0956   LEUKOCYTESUR Negative 06/07/2019 0914    Pertinent Imaging:   Assessment & Plan: Patient has mixed incontinence and primarily stress incontinence.  She has failed prior occasion.  I went over sling in detail with usual template.  Mesh issues described.  I went over bulking agent in detail.  Template utilized.  Persistent or worsening overactive bladder and soaking episodes if due to overactive bladder could persist  Therapy and watchful waiting discussed as well.  Patient would like to proceed with bulking agent and she was a little bit reluctant to proceed with a sling.  I think she is made a good choice.  We will call and schedule  There are no diagnoses linked to this encounter.  No follow-ups on file.  Reece Packer, MD  [facility] [address], [place], [place] [address] [phone]

## 2020-01-25 ENCOUNTER — Other Ambulatory Visit: Payer: Self-pay | Admitting: Obstetrics & Gynecology

## 2020-05-09 ENCOUNTER — Encounter: Payer: Self-pay | Admitting: Obstetrics & Gynecology

## 2020-05-09 ENCOUNTER — Other Ambulatory Visit: Payer: Self-pay

## 2020-05-09 ENCOUNTER — Ambulatory Visit (INDEPENDENT_AMBULATORY_CARE_PROVIDER_SITE_OTHER): Payer: Medicare HMO | Admitting: Obstetrics & Gynecology

## 2020-05-09 VITALS — BP 100/60 | Ht 63.5 in | Wt 141.0 lb

## 2020-05-09 DIAGNOSIS — N3281 Overactive bladder: Secondary | ICD-10-CM

## 2020-05-09 DIAGNOSIS — Z90710 Acquired absence of both cervix and uterus: Secondary | ICD-10-CM

## 2020-05-09 DIAGNOSIS — N952 Postmenopausal atrophic vaginitis: Secondary | ICD-10-CM

## 2020-05-09 NOTE — Progress Notes (Signed)
Gynecology Evaluation   Chief Complaint: Leakage of urine, vaginal dryness  History of Present Illness:   Patient is a 80 y.o. presents today for a problem visit.  She has been having prior issues with vaginal dryness and atrophy, and is on bio-identical hormone therapy thru [employer], as well as Replens, which she says is working well.  The patient is menopausal. She complains of urinary symptoms of urinary frequency, urinary incontinence and urinary urgency. Patient reports her symptoms began several years ago and its severity is described as moderate. She is having leakage of urine with stressful activities, such as coughing, sneezing, laughter, or exercise.  She is having leakage of urine based on symptoms of urgency or bladder spasm/discomfort.  She is not having nocturia.  She is using pads or diapers daily for control of symptoms. She is not having dysuria.  She has not had a history of UTI.  She drinks about 1 caffeinated drinks per day.  She has had prior procedures for incontinence.  Last year she had a visit w urology including bulking agent injections.  She did not have follow up because they hurt and she had one day of urinary retention following the procedure.  The sx's of incontinence have gradually resumed to same as baseline prior to that procedure.  She is concerned over the risks of surgery such as sling procedures.  PMHx: She  has a past medical history of Overactive bladder and Thyroid disease. Also,  has a past surgical history that includes Appendectomy; Abdominal hysterectomy; and Breast surgery., family history includes Breast cancer in her maternal aunt; Cancer in her brother, maternal aunt, and paternal grandmother; Lung cancer in her brother; Uterine cancer in her paternal grandmother.,  reports that she has never smoked. She has never used smokeless tobacco. She reports that she does not drink alcohol and does not use drugs.  She has a current medication  list which includes the following prescription(s): atorvastatin, hormone cream base, levothyroxine, metoprolol tartrate, omeprazole, and replens. Also, is allergic to sulfa antibiotics.  Review of Systems  Constitutional: Negative for chills, fever and malaise/fatigue.  HENT: Negative for congestion, sinus pain and sore throat.   Eyes: Negative for blurred vision and pain.  Respiratory: Negative for cough and wheezing.   Cardiovascular: Negative for chest pain and leg swelling.  Gastrointestinal: Negative for abdominal pain, constipation, diarrhea, heartburn, nausea and vomiting.  Genitourinary: Positive for frequency and urgency. Negative for dysuria and hematuria.  Musculoskeletal: Negative for back pain, joint pain, myalgias and neck pain.  Skin: Negative for itching and rash.  Neurological: Negative for dizziness, tremors and weakness.  Endo/Heme/Allergies: Does not bruise/bleed easily.  Psychiatric/Behavioral: Negative for depression. The patient is not nervous/anxious and does not have insomnia.     Objective: BP 100/60   Ht 5' 3.5" (1.613 m)   Wt 141 lb (64 kg)   BMI 24.59 kg/m  Physical Exam Constitutional:      General: She is not in acute distress.    Appearance: She is well-developed.  Genitourinary:     Vulva, urethra, bladder, vagina, rectum and urethral meatus normal.     No lesions in the vagina.     Genitourinary Comments: Vaginal cuff well healed     No vaginal bleeding.     No vaginal prolapse present.    Mild vaginal atrophy present.     Right Adnexa: not tender and no mass present.    Left Adnexa: not tender and no mass present.  Cervix is absent.     Uterus is absent.     Pelvic exam was performed with patient supine.  Breasts:     Right: No mass, skin change or tenderness.     Left: No mass, skin change or tenderness.    HENT:     Head: Normocephalic and atraumatic. No laceration.     Right Ear: Hearing normal.     Left Ear: Hearing normal.      Mouth/Throat:     Pharynx: Uvula midline.  Eyes:     Pupils: Pupils are equal, round, and reactive to light.  Neck:     Thyroid: No thyromegaly.  Cardiovascular:     Rate and Rhythm: Normal rate and regular rhythm.     Heart sounds: No murmur heard. No friction rub. No gallop.   Pulmonary:     Effort: Pulmonary effort is normal. No respiratory distress.     Breath sounds: Normal breath sounds. No wheezing.  Abdominal:     General: Bowel sounds are normal. There is no distension.     Palpations: Abdomen is soft.     Tenderness: There is no abdominal tenderness. There is no rebound.  Musculoskeletal:        General: Normal range of motion.     Cervical back: Normal range of motion and neck supple.  Neurological:     Mental Status: She is alert and oriented to person, place, and time.     Cranial Nerves: No cranial nerve deficit.  Skin:    General: Skin is warm and dry.  Psychiatric:        Judgment: Judgment normal.  Vitals reviewed.     Female chaperone present for pelvic portion of the physical exam  Assessment:  1. History of hysterectomy I see no reason or likely benefit from pessary therapy, and she prefers to not go this route  2. Vaginal atrophy Cont Replens.  She uses only once a month but she feels this is all she needs. Cont bio-identical hormone therapy.  Risks explained.  She desires to not change anything with this therapy.  Thru [facility].  3. Detrusor instability of bladder Urology to cont to make recommendations.  Counseled she may need more than one urethral injection procedures to ultimately achieve success.  Surgery discussed as option too.  She is not on any medicines for OAB.    Annamarie Major, MD, [facility], [facility] 05/09/2020  8:27 AM

## 2020-06-10 ENCOUNTER — Ambulatory Visit: Payer: Self-pay | Admitting: Urology

## 2020-07-01 ENCOUNTER — Ambulatory Visit (INDEPENDENT_AMBULATORY_CARE_PROVIDER_SITE_OTHER): Payer: Medicare HMO | Admitting: Urology

## 2020-07-01 ENCOUNTER — Other Ambulatory Visit: Payer: Self-pay | Admitting: Obstetrics & Gynecology

## 2020-07-01 ENCOUNTER — Other Ambulatory Visit: Payer: Self-pay

## 2020-07-01 VITALS — BP 138/86 | HR 77

## 2020-07-01 DIAGNOSIS — N3946 Mixed incontinence: Secondary | ICD-10-CM | POA: Diagnosis not present

## 2020-07-01 MED ORDER — HORMONE CREAM BASE CREA: TOPICAL_CREAM | 11 refills | Status: DC

## 2020-07-01 NOTE — Progress Notes (Signed)
07/01/2020 2:37 PM   Erica Donaldson July 01, 1940 [id number]  Referring provider: Marisue Ivan, MD [phone] [facility] [address] [facility] [place],  [place] [address]  Chief Complaint  Patient presents with  . Urinary Incontinence    HPI:  Patient leaks with coughing sneezing bending and lifting. She leaks when she walks. She has no bedwetting. If she waits too long she can have urge incontinence but otherwise does not. She can soak 3 pads a day  Her flow was good. She gets up once or twice at night and voids every 2 hours during the day.  She had 1 Botox treatment and that helped her for 6 weeks and this was 4 years ago. It appears she is tried to antimuscarinics but could not remember the names for certain and had side effects and lack of efficacy  Mild grade 2 hypermobility the bladder neck and no stress incontinence. No prolapse. Moderate atrophy  Patient has mixed incontinence but primarily stress incontinence. The role of urodynamics discussed. Depending on goals asling or a bulking agent might be a treatment option for her. Bulking agent may be excellent based upon physical examination and patient age  During urodynamics patient voided 207 mL with a maximum flow 30 mils per second.  She was catheterized to 75 mL.  Maximum bladder capacity was 350 mL.  She had a hypersensitive bladder.  Bladder was stable.  At 200 mL her leak point pressure is 94 cm water with moderate leakage.  At 300 mL her leak point pressure was 67 cm of water with mild leakage.  During voluntary voiding she voided 316 mL with a maximum flow 24 mils per second.  Maximum voiding pressure 9 cm water.  Residual 20 mL.  EMG activity increased during voiding.  Bladder neck descent at 1 or 2 cm.  Contraction was weak but well sustained.   Patient has mixed incontinence and primarily stress incontinence.  She has failed prior occasion.  I went over sling in detail with usual template.  Mesh  issues described.  I went over bulking agent in detail.  Template utilized.  Persistent or worsening overactive bladder and soaking episodes if due to overactive bladder could persist  Therapy and watchful waiting discussed as well.  Patient would like to proceed with bulking agent and she was a little bit reluctant to proceed with a sling.  I think she is made a good choice.  We will call and schedule  Today Frequency stable.  I last saw the patient in July 2021 in [place].  The bulking treatment was done October 18, 2019.  When I saw her a few weeks later she was almost completely dry.  Patient had trouble to void after the bulking treatment requiring in and out catheter.  Within 3 weeks was no longer working.  She still leaks a lot with coughing sneezing walking.  If she holds it too long she has urge incontinence.  She is considering surgery now  Clinically not infected   PMH: Past Medical History:  Diagnosis Date  . Overactive bladder   . Thyroid disease     Surgical History: Past Surgical History:  Procedure Laterality Date  . ABDOMINAL HYSTERECTOMY    . APPENDECTOMY    . BREAST SURGERY      Home Medications:  Allergies as of 07/01/2020      Reactions   Sulfa Antibiotics Rash      Medication List       Accurate as of  July 01, 2020  2:37 PM. If you have any questions, ask your nurse or doctor.        acetaminophen 500 MG tablet Commonly known as: TYLENOL Take by mouth.   atorvastatin 10 MG tablet Commonly known as: LIPITOR   Hormone Cream Base Crea [facility] to refill as before   levothyroxine 25 MCG tablet Commonly known as: SYNTHROID Take by mouth.   metoprolol succinate 25 MG 24 hr tablet Commonly known as: TOPROL-XL Take by mouth.   metoprolol tartrate 50 MG tablet Commonly known as: LOPRESSOR Take by mouth.   omeprazole 20 MG capsule Commonly known as: PRILOSEC TAKE 1 CAPSULE DAILY   Replens Gel Place 1 Applicatorful vaginally 2  (two) times a week.       Allergies:  Allergies  Allergen Reactions  . Sulfa Antibiotics Rash    Family History: Family History  Problem Relation Age of Onset  . Cancer Brother   . Lung cancer Brother   . Cancer Maternal Aunt   . Breast cancer Maternal Aunt   . Cancer Paternal Grandmother   . Uterine cancer Paternal Grandmother     Social History:  reports that she has never smoked. She has never used smokeless tobacco. She reports that she does not drink alcohol and does not use drugs.  ROS:                                        Physical Exam: BP 138/86   Pulse 77   Constitutional:  Alert and oriented, No acute distress. HEENT: [place] AT, moist mucus membranes.  Trachea midline, no masses. Cardiovascular: No clubbing, cyanosis, or edema. Respiratory: Normal respiratory effort, no increased work of breathing. GI: Abdomen is soft, nontender, nondistended, no abdominal masses   Laboratory Data: No results found for: WBC, HGB, HCT, MCV, PLT  No results found for: CREATININE  No results found for: PSA  No results found for: TESTOSTERONE  No results found for: HGBA1C  Urinalysis    Component Value Date/Time   COLORURINE YELLOW (A) 05/22/2017 0956   APPEARANCEUR Clear 06/07/2019 0914   LABSPEC 1.019 05/22/2017 0956   PHURINE 6.0 05/22/2017 0956   GLUCOSEU Trace (A) 06/07/2019 0914   HGBUR NEGATIVE 05/22/2017 0956   BILIRUBINUR Negative 06/07/2019 0914   KETONESUR NEGATIVE 05/22/2017 0956   PROTEINUR Negative 06/07/2019 0914   PROTEINUR NEGATIVE 05/22/2017 0956   NITRITE Negative 06/07/2019 0914   NITRITE NEGATIVE 05/22/2017 0956   LEUKOCYTESUR Negative 06/07/2019 0914    Pertinent Imaging:   Assessment & Plan: Send urine for culture.  Reassess in 5 weeks on the new beta 3 agonist.  Go over sling again in detail.  She agreed with the plan  There are no diagnoses linked to this encounter.  No follow-ups on file.  Martina Sinner, MD  [facility] [address] [place], [place] [address] [phone]

## 2020-07-02 LAB — URINALYSIS, COMPLETE
Bilirubin, UA: NEGATIVE
Glucose, UA: NEGATIVE
Ketones, UA: NEGATIVE
Leukocytes,UA: NEGATIVE
Nitrite, UA: NEGATIVE
Protein,UA: NEGATIVE
Specific Gravity, UA: 1.015 (ref 1.005–1.030)
Urobilinogen, Ur: 0.2 mg/dL (ref 0.2–1.0)
pH, UA: 7 (ref 5.0–7.5)

## 2020-07-02 LAB — MICROSCOPIC EXAMINATION
Bacteria, UA: NONE SEEN
WBC, UA: NONE SEEN /hpf (ref 0–5)

## 2020-07-03 LAB — CULTURE, URINE COMPREHENSIVE

## 2020-08-05 ENCOUNTER — Encounter: Payer: Self-pay | Admitting: Urology

## 2020-08-05 ENCOUNTER — Other Ambulatory Visit: Payer: Self-pay

## 2020-08-05 ENCOUNTER — Ambulatory Visit (INDEPENDENT_AMBULATORY_CARE_PROVIDER_SITE_OTHER): Payer: Medicare HMO | Admitting: Urology

## 2020-08-05 VITALS — BP 138/77 | HR 73 | Ht 63.0 in | Wt 143.0 lb

## 2020-08-05 DIAGNOSIS — N3946 Mixed incontinence: Secondary | ICD-10-CM | POA: Diagnosis not present

## 2020-08-05 MED ORDER — VIBEGRON 75 MG PO TABS: 75.0000 mg | ORAL_TABLET | Freq: Every day | ORAL | 11 refills | Status: DC

## 2020-08-05 NOTE — Progress Notes (Signed)
08/05/2020 10:44 AM   Erica Donaldson December 13, 1940 [id number]  Referring provider: Marisue Ivan, MD [phone] [facility] [address] [facility] [place],  [place] [address]  Chief Complaint  Patient presents with  . Urinary Incontinence    HPI: Reviewed last note in detail.  She has mixed incontinence with stress incontinence.  Bulking agent worked well but was only for a few weeks.  Still has stress incontinence.  She waits too long she has urge incontinence.  She was given the new beta 3 agonist  Patient said she was 90% better but may have had a little bit of bloating or sore throat.  Headache for 2 days went away.  She wants to go back on it.  This supports an overactive bladder component.  She wants to take it until she starts feeling benefit which happened immediately and then go to every second day.  6 weeks of samples given.  We will reassess her symptoms in about 6 or 7 weeks.   PMH: Past Medical History:  Diagnosis Date  . Overactive bladder   . Thyroid disease     Surgical History: Past Surgical History:  Procedure Laterality Date  . ABDOMINAL HYSTERECTOMY    . APPENDECTOMY    . BREAST SURGERY      Home Medications:  Allergies as of 08/05/2020      Reactions   Sulfa Antibiotics Rash      Medication List       Accurate as of August 05, 2020 10:44 AM. If you have any questions, ask your nurse or doctor.        acetaminophen 500 MG tablet Commonly known as: TYLENOL Take by mouth.   atorvastatin 10 MG tablet Commonly known as: LIPITOR   Hormone Cream Base Crea [facility] to refill as before   levothyroxine 25 MCG tablet Commonly known as: SYNTHROID Take by mouth.   metoprolol succinate 25 MG 24 hr tablet Commonly known as: TOPROL-XL Take by mouth.   metoprolol tartrate 50 MG tablet Commonly known as: LOPRESSOR Take by mouth.   omeprazole 20 MG capsule Commonly known as: PRILOSEC TAKE 1 CAPSULE DAILY   Replens Gel Place 1  Applicatorful vaginally 2 (two) times a week.       Allergies:  Allergies  Allergen Reactions  . Sulfa Antibiotics Rash    Family History: Family History  Problem Relation Age of Onset  . Cancer Brother   . Lung cancer Brother   . Cancer Maternal Aunt   . Breast cancer Maternal Aunt   . Cancer Paternal Grandmother   . Uterine cancer Paternal Grandmother     Social History:  reports that she has never smoked. She has never used smokeless tobacco. She reports that she does not drink alcohol and does not use drugs.  ROS:                                        Physical Exam: BP 138/77   Pulse 73   Ht 5\' 3"  (1.6 m)   Wt 64.9 kg   BMI 25.33 kg/m   Constitutional:  Alert and oriented, No acute distress. HEENT: [place] AT, moist mucus membranes.  Trachea midline, no masses.   Laboratory Data: No results found for: WBC, HGB, HCT, MCV, PLT  No results found for: CREATININE  No results found for: PSA  No results found for: TESTOSTERONE  No results found for: HGBA1C  Urinalysis    Component Value Date/Time   COLORURINE YELLOW (A) 05/22/2017 0956   APPEARANCEUR Clear 07/01/2020 1455   LABSPEC 1.019 05/22/2017 0956   PHURINE 6.0 05/22/2017 0956   GLUCOSEU Negative 07/01/2020 1455   HGBUR NEGATIVE 05/22/2017 0956   BILIRUBINUR Negative 07/01/2020 1455   KETONESUR NEGATIVE 05/22/2017 0956   PROTEINUR Negative 07/01/2020 1455   PROTEINUR NEGATIVE 05/22/2017 0956   NITRITE Negative 07/01/2020 1455   NITRITE NEGATIVE 05/22/2017 0956   LEUKOCYTESUR Negative 07/01/2020 1455    Pertinent Imaging:   Assessment & Plan: I think patient still a candidate for a sling but it does support the overactive bladder component.  Other OAB options could also be considered especially if she is trying to avoid surgery.  There are no diagnoses linked to this encounter.  No follow-ups on file.  Martina Sinner, MD  [facility] [address] [place], [place] [address] [phone]

## 2020-09-16 ENCOUNTER — Ambulatory Visit: Payer: Self-pay | Admitting: Urology

## 2020-10-07 ENCOUNTER — Ambulatory Visit: Payer: Self-pay | Admitting: Urology

## 2020-11-04 ENCOUNTER — Other Ambulatory Visit: Payer: Self-pay

## 2020-11-04 ENCOUNTER — Ambulatory Visit (INDEPENDENT_AMBULATORY_CARE_PROVIDER_SITE_OTHER): Payer: Medicare HMO | Admitting: Urology

## 2020-11-04 VITALS — BP 128/77 | HR 71 | Wt 143.0 lb

## 2020-11-04 DIAGNOSIS — N3946 Mixed incontinence: Secondary | ICD-10-CM | POA: Diagnosis not present

## 2020-11-04 MED ORDER — VIBEGRON 75 MG PO TABS: 75.0000 mg | ORAL_TABLET | Freq: Every day | ORAL | 3 refills | Status: AC

## 2020-11-04 NOTE — Progress Notes (Signed)
11/04/2020 10:39 AM   Erica Donaldson 03/06/1941 [id number]  Referring provider: Marisue Ivan, MD [phone] [facility] [address] [facility] [place],  [place] [address]  Chief Complaint  Patient presents with   Urinary Incontinence    HPI: Reviewed last note in detail.  She has mixed incontinence with stress incontinence.  Bulking agent worked well but was only for a few weeks.  Still has stress incontinence.  She waits too long she has urge incontinence.  She was given the new beta 3 agonist   Patient said she was 90% better but may have had a little bit of bloating or sore throat.  Headache for 2 days went away.  She wants to go back on it.  This supports an overactive bladder component.  She wants to take it until she starts feeling benefit which happened immediately and then go to every second day.  6 weeks of samples given.  We will reassess her symptoms in about 6 or 7 weeks.    I think patient still a candidate for a sling but it does support the overactive bladder component.  Other OAB options could also be considered especially if she is trying to avoid surgery.  TOday Patient is almost completely dry on the new beta 3 agonist.  She takes it for 3 days and not for 4 days.  Frequency stable.  No infections.  Prescription given was more samples.   PMH: Past Medical History:  Diagnosis Date   Overactive bladder    Thyroid disease     Surgical History: Past Surgical History:  Procedure Laterality Date   ABDOMINAL HYSTERECTOMY     APPENDECTOMY     BREAST SURGERY      Home Medications:  Allergies as of 11/04/2020       Reactions   Sulfa Antibiotics Rash        Medication List        Accurate as of November 04, 2020 10:39 AM. If you have any questions, ask your nurse or doctor.          STOP taking these medications    metoprolol tartrate 50 MG tablet Commonly known as: LOPRESSOR Stopped by: Martina Sinner, MD       TAKE these medications     acetaminophen 500 MG tablet Commonly known as: TYLENOL Take by mouth.   atorvastatin 10 MG tablet Commonly known as: LIPITOR   Hormone Cream Base Crea [facility] to refill as before   levothyroxine 25 MCG tablet Commonly known as: SYNTHROID Take by mouth.   metoprolol succinate 50 MG 24 hr tablet Commonly known as: TOPROL-XL Take 1 tablet by mouth 2 (two) times daily. What changed: Another medication with the same name was removed. Continue taking this medication, and follow the directions you see here. Changed by: Martina Sinner, MD   omeprazole 20 MG capsule Commonly known as: PRILOSEC TAKE 1 CAPSULE DAILY   Replens Gel Place 1 Applicatorful vaginally 2 (two) times a week.   Vibegron 75 MG Tabs Take 75 mg by mouth daily.        Allergies:  Allergies  Allergen Reactions   Sulfa Antibiotics Rash    Family History: Family History  Problem Relation Age of Onset   Cancer Brother    Lung cancer Brother    Cancer Maternal Aunt    Breast cancer Maternal Aunt    Cancer Paternal Grandmother    Uterine cancer Paternal Grandmother     Social  History:  reports that she has never smoked. She has never used smokeless tobacco. She reports that she does not drink alcohol and does not use drugs.  ROS:                                        Physical Exam: BP 128/77   Pulse 71   Wt 64.9 kg   BMI 25.33 kg/m   Constitutional:  Alert and oriented, No acute distress. HEENT: [place] AT, moist mucus membranes.  Trachea midline, no masses.  Laboratory Data: No results found for: WBC, HGB, HCT, MCV, PLT  No results found for: CREATININE  No results found for: PSA  No results found for: TESTOSTERONE  No results found for: HGBA1C  Urinalysis    Component Value Date/Time   COLORURINE YELLOW (A) 05/22/2017 0956   APPEARANCEUR Clear 07/01/2020 1455   LABSPEC 1.019 05/22/2017 0956   PHURINE 6.0 05/22/2017 0956   GLUCOSEU Negative  07/01/2020 1455   HGBUR NEGATIVE 05/22/2017 0956   BILIRUBINUR Negative 07/01/2020 1455   KETONESUR NEGATIVE 05/22/2017 0956   PROTEINUR Negative 07/01/2020 1455   PROTEINUR NEGATIVE 05/22/2017 0956   NITRITE Negative 07/01/2020 1455   NITRITE NEGATIVE 05/22/2017 0956   LEUKOCYTESUR Negative 07/01/2020 1455    Pertinent Imaging:   Assessment & Plan: Dramatic improvement on new beta 3 agonist of mixed incontinence.  I will help out with the $100 a month co-pay but given her samples every 6 months.  Reassess in 6 months  There are no diagnoses linked to this encounter.  No follow-ups on file.  Martina Sinner, MD  [facility] [address] [place], [place] [address] [phone]

## 2020-11-04 NOTE — Addendum Note (Signed)
Addended by: Veneta Penton on: 11/04/2020 10:57 AM   Modules accepted: Orders

## 2020-11-12 ENCOUNTER — Telehealth: Payer: Self-pay | Admitting: *Deleted

## 2020-11-12 NOTE — Telephone Encounter (Signed)
Left message for pt. We do not have her prescription card on file. Need the card or ID number and which insurance to proceed with PA.

## 2020-11-12 NOTE — Telephone Encounter (Signed)
Please call the back of her [employer] to get vibegron approved for patient

## 2020-11-15 NOTE — Telephone Encounter (Signed)
Sw pt. Will put samples up front for pt of gemtesa

## 2021-02-10 ENCOUNTER — Other Ambulatory Visit: Payer: Self-pay | Admitting: Obstetrics & Gynecology

## 2021-02-10 ENCOUNTER — Telehealth: Payer: Self-pay

## 2021-02-10 NOTE — Telephone Encounter (Signed)
Pharmacy needs to know what Hormone cream base they are to fill? And is it topically ?

## 2021-02-10 NOTE — Telephone Encounter (Signed)
[facility], I presume.  Testosterone topical cream

## 2021-02-10 NOTE — Telephone Encounter (Signed)
Pharmacy aware

## 2021-03-20 IMAGING — US US ABDOMEN LIMITED
1 series · 14 of 25 positions shown · non-contrast
Comparison: None.

CLINICAL DATA: Right upper quadrant pain

EXAM:
ULTRASOUND ABDOMEN LIMITED RIGHT UPPER QUADRANT

[Series 1: us abdomen limited · 0.15mm/px · 14 of 64 slices shown]
[im 1/64]
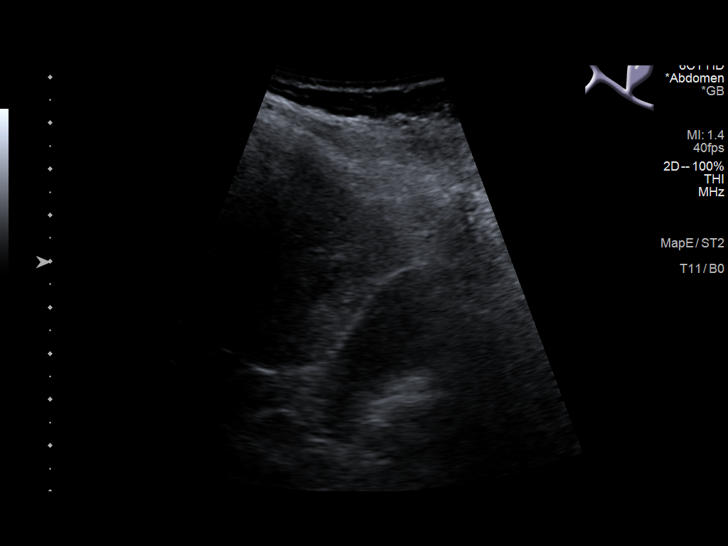
[im 6/64]
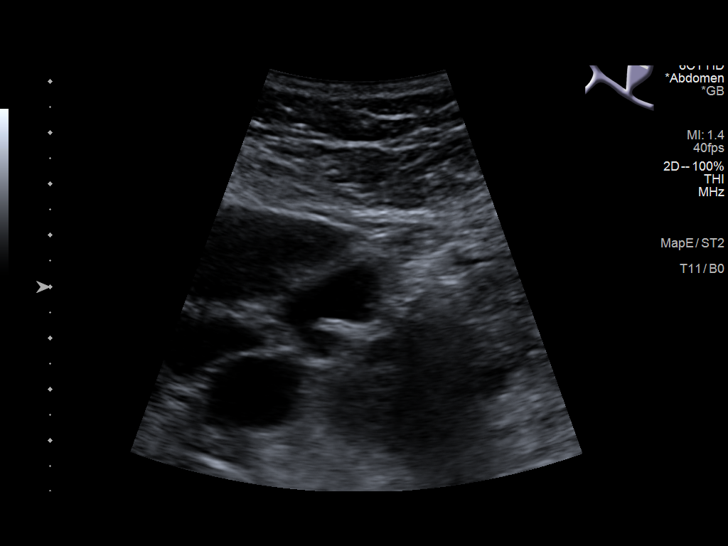
[im 11/64]
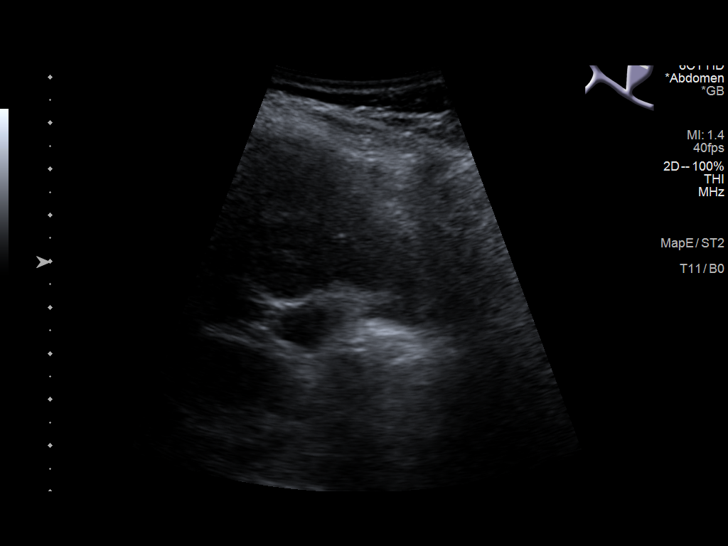
[im 16/64]
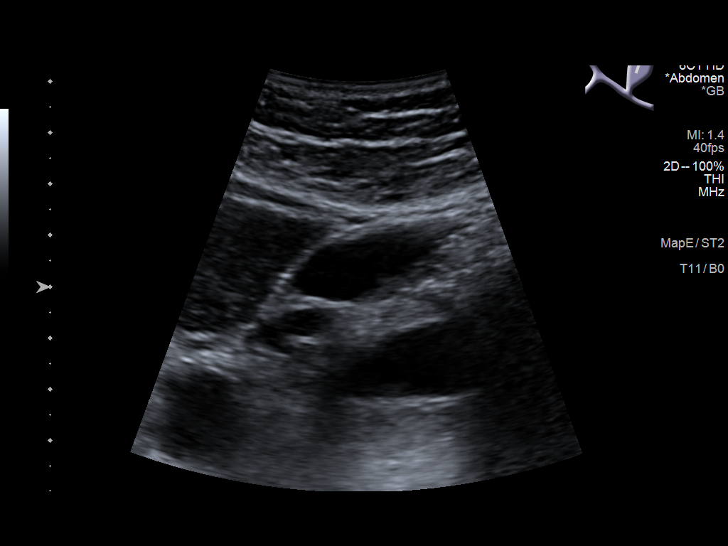
[im 22/64]
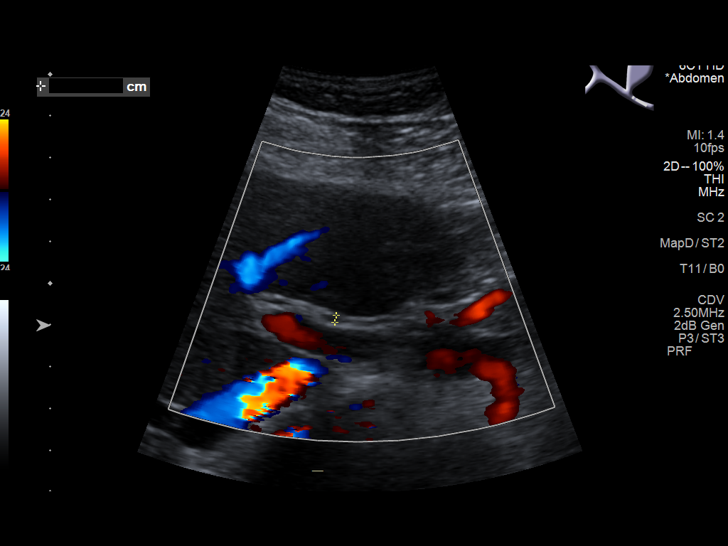
[im 24/64]
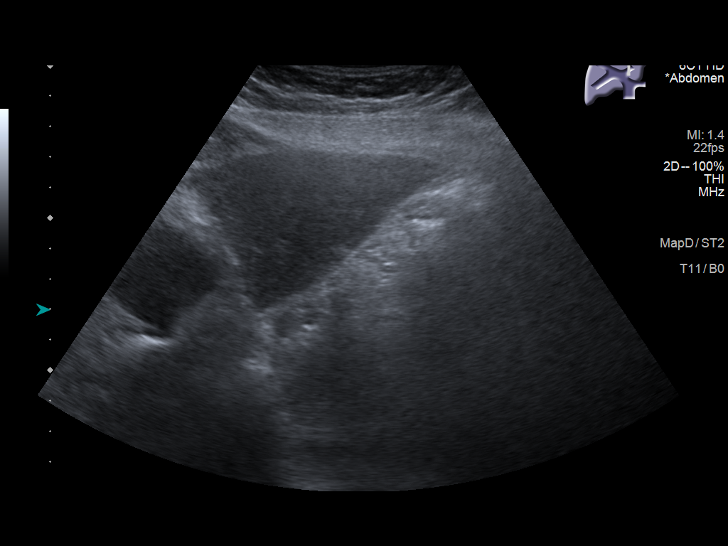
[im 29/64]
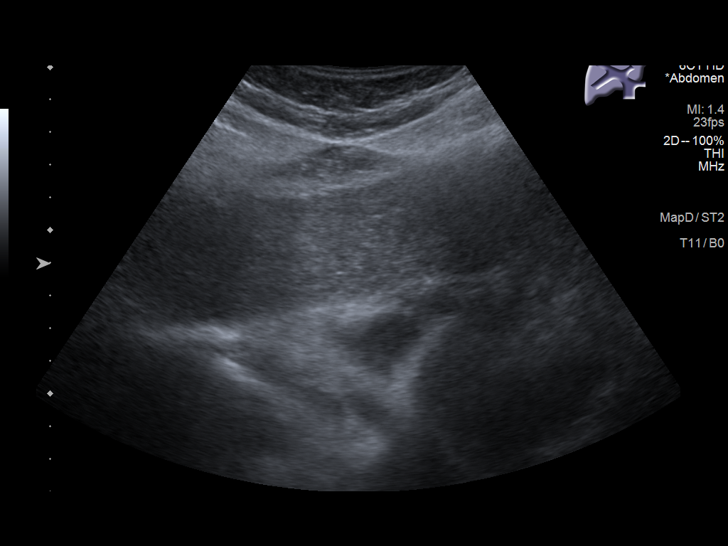
[im 35/64]
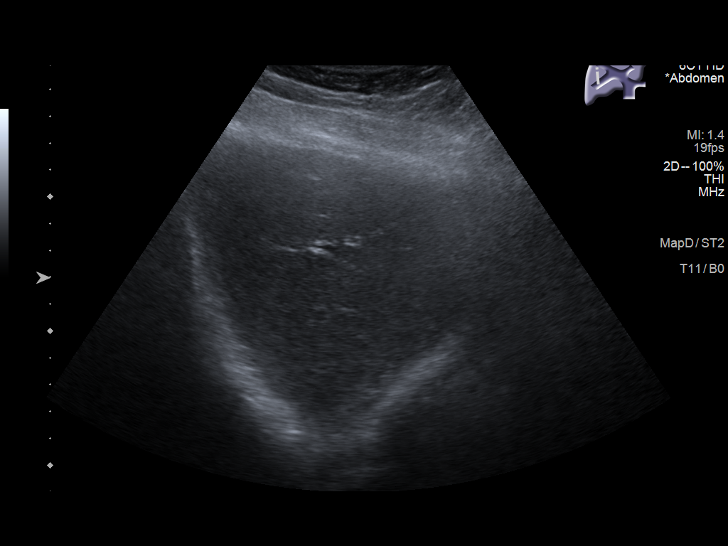
[im 40/64]
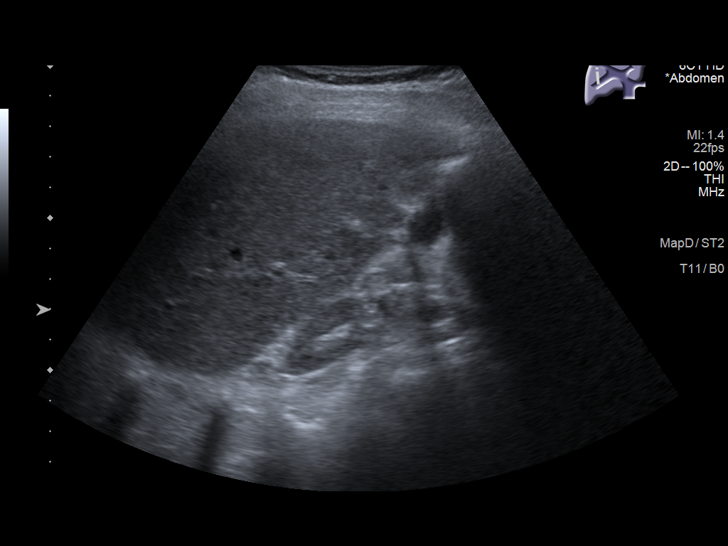
[im 43/64]
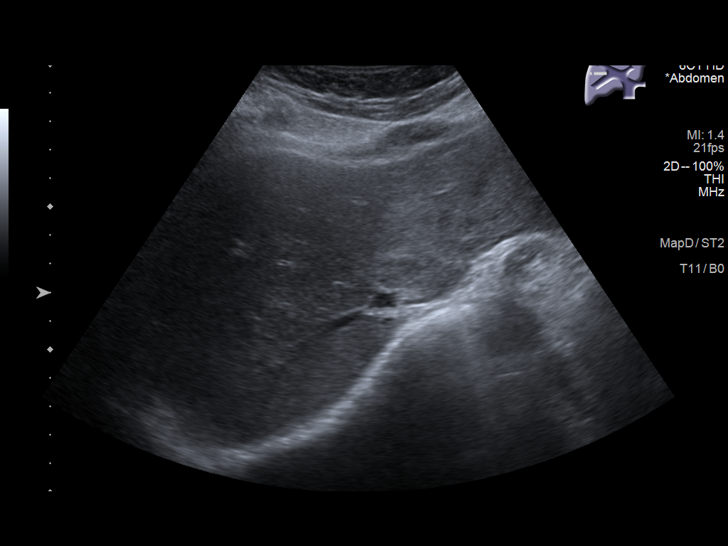
[im 48/64]
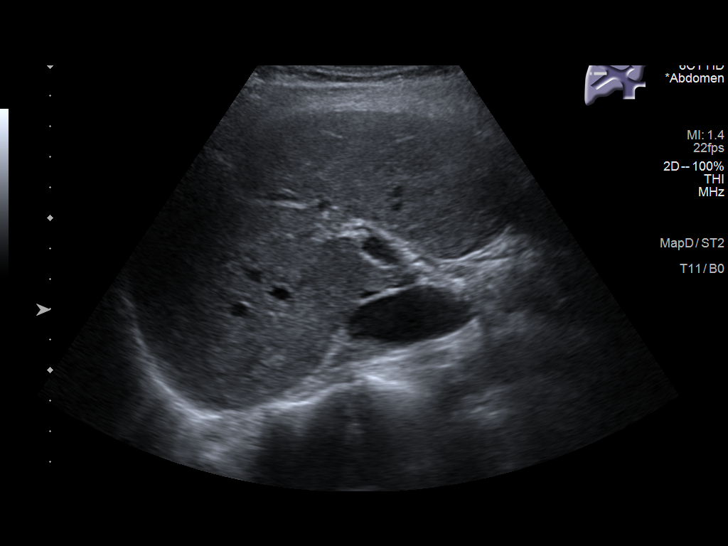
[im 53/64]
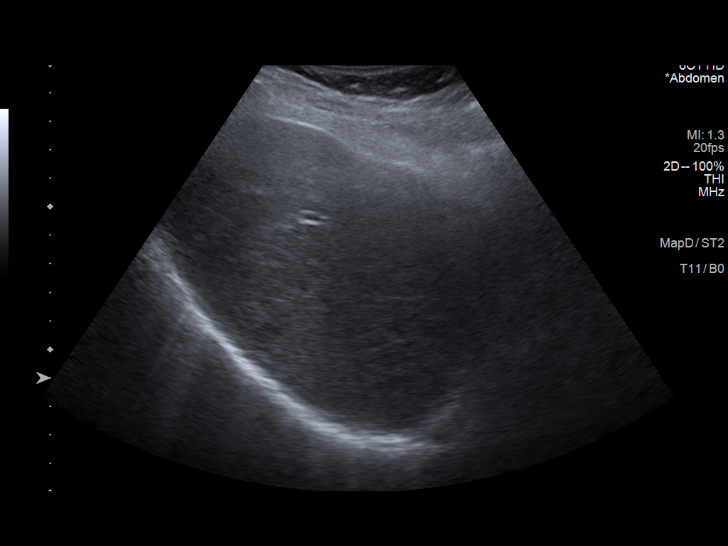
[im 58/64]
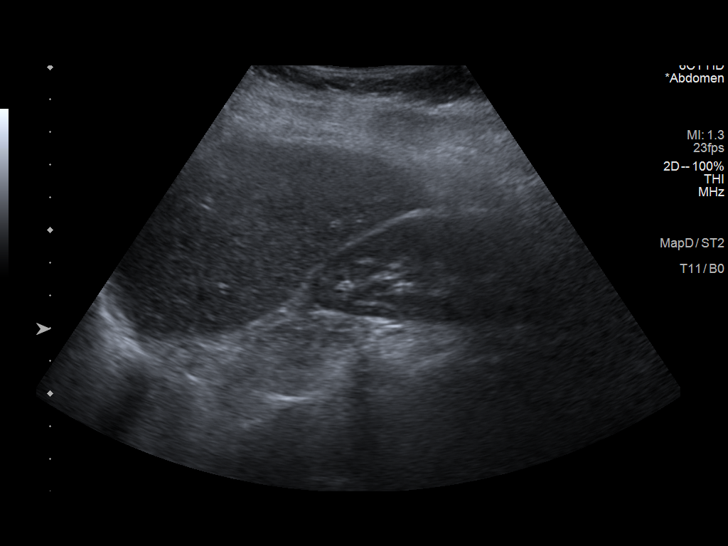
[im 64/64]
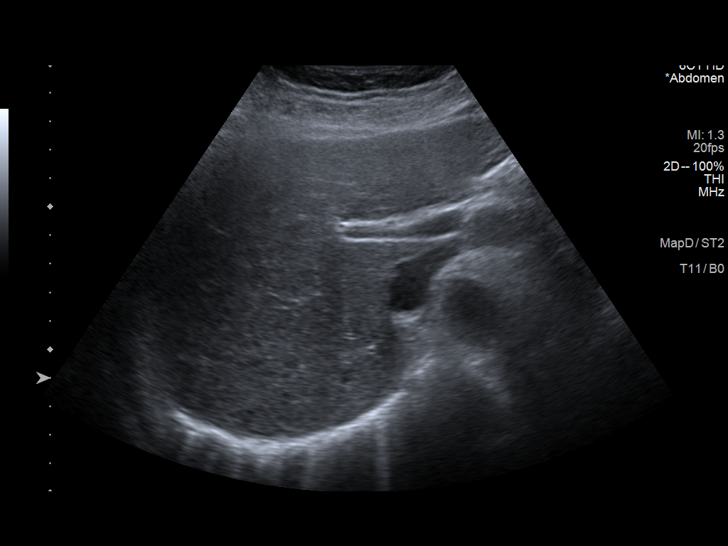

[14 of 25 positions shown; findings below may reference images not displayed]

FINDINGS: Gallbladder:

No gallstones or wall thickening visualized. There is no
pericholecystic fluid. No sonographic Murphy sign noted by
sonographer.

Common bile duct:

Diameter: 2 mm. No intrahepatic or extrahepatic biliary duct
dilatation.

Liver:

No focal lesion identified. Within normal limits in parenchymal
echogenicity. Portal vein is patent on color Doppler imaging with
normal direction of blood flow towards the liver.

Other: None.
IMPRESSION: Study within normal limits.

## 2021-04-15 ENCOUNTER — Other Ambulatory Visit: Payer: Self-pay | Admitting: Obstetrics & Gynecology

## 2021-04-15 ENCOUNTER — Telehealth: Payer: Self-pay

## 2021-04-15 DIAGNOSIS — Z1239 Encounter for other screening for malignant neoplasm of breast: Secondary | ICD-10-CM

## 2021-04-15 NOTE — Telephone Encounter (Signed)
Patient aware.

## 2021-04-15 NOTE — Telephone Encounter (Signed)
done

## 2021-04-15 NOTE — Telephone Encounter (Signed)
Pt needs mammogram orders for [place] please

## 2021-05-12 ENCOUNTER — Encounter: Payer: Self-pay | Admitting: Urology

## 2021-05-12 ENCOUNTER — Other Ambulatory Visit: Payer: Self-pay

## 2021-05-12 ENCOUNTER — Ambulatory Visit (INDEPENDENT_AMBULATORY_CARE_PROVIDER_SITE_OTHER): Payer: Medicare HMO | Admitting: Urology

## 2021-05-12 VITALS — BP 112/67 | HR 80 | Ht 63.0 in | Wt 143.0 lb

## 2021-05-12 DIAGNOSIS — N3946 Mixed incontinence: Secondary | ICD-10-CM | POA: Diagnosis not present

## 2021-05-12 NOTE — Progress Notes (Signed)
05/12/2021 10:32 AM   Erica Donaldson 1940-05-20 [id number]  Referring provider: Marisue Ivan, MD [phone] [facility] [address] [facility] [place],  [place] [address]  Chief Complaint  Patient presents with   Follow-up    HPI: Reviewed last note in detail.  She has mixed incontinence with stress incontinence.  Bulking agent worked well but was only for a few weeks.  Still has stress incontinence.  She waits too long she has urge incontinence.  She was given the new beta 3 agonist   Patient said she was 90% better but may have had a little bit of bloating or sore throat.  Headache for 2 days went away.  She wants to go back on it.  This supports an overactive bladder component.  She wants to take it until she starts feeling benefit which happened immediately and then go to every second day.  6 weeks of samples given.  We will reassess her symptoms in about 6 or 7 weeks.     I think patient still a candidate for a sling but it does support the overactive bladder component.  Other OAB options could also be considered especially if she is trying to avoid surgery.   Patient is almost completely dry on the new beta 3 agonist.  She takes it for 3 days and not for 4 days.  Frequency stable.  No infections.  Prescription given was more samples.  Dramatic improvement on new beta 3 agonist of mixed incontinence.  I will help out with the $100 a month co-pay but given her samples every 6 months.  Reassess in 6 months  Today Patient dramatically better with reduced urge incontinence on Gemtesa.  Some weeks she does not take it.  Sometimes she takes it 3 times a week.  She has been using some samples.  Clinically not infected.  Frequency stable   PMH: Past Medical History:  Diagnosis Date   Overactive bladder    Thyroid disease     Surgical History: Past Surgical History:  Procedure Laterality Date   ABDOMINAL HYSTERECTOMY     APPENDECTOMY     BREAST SURGERY      Home  Medications:  Allergies as of 05/12/2021       Reactions   Sulfa Antibiotics Rash        Medication List        Accurate as of May 12, 2021 10:32 AM. If you have any questions, ask your nurse or doctor.          acetaminophen 500 MG tablet Commonly known as: TYLENOL Take by mouth.   atorvastatin 10 MG tablet Commonly known as: LIPITOR   Hormone Cream Base Crea Apply Topically once daily prn   levothyroxine 25 MCG tablet Commonly known as: SYNTHROID Take by mouth.   metoprolol succinate 50 MG 24 hr tablet Commonly known as: TOPROL-XL Take 1 tablet by mouth 2 (two) times daily.   omeprazole 20 MG capsule Commonly known as: PRILOSEC TAKE 1 CAPSULE DAILY   Replens Gel Place 1 Applicatorful vaginally 2 (two) times a week.   Vibegron 75 MG Tabs Take 75 mg by mouth daily.        Allergies:  Allergies  Allergen Reactions   Sulfa Antibiotics Rash    Family History: Family History  Problem Relation Age of Onset   Cancer Brother    Lung cancer Brother    Cancer Maternal Aunt    Breast cancer Maternal Aunt    Cancer Paternal  Grandmother    Uterine cancer Paternal Grandmother     Social History:  reports that she has never smoked. She has never used smokeless tobacco. She reports that she does not drink alcohol and does not use drugs.  ROS:                                        Physical Exam: BP 112/67    Pulse 80    Ht 5\' 3"  (1.6 m)    Wt 64.9 kg    BMI 25.33 kg/m   Constitutional:  Alert and oriented, No acute distress. HEENT: [place] AT, moist mucus membranes.  Trachea midline, no masses.  Laboratory Data: No results found for: WBC, HGB, HCT, MCV, PLT  No results found for: CREATININE  No results found for: PSA  No results found for: TESTOSTERONE  No results found for: HGBA1C  Urinalysis    Component Value Date/Time   COLORURINE YELLOW (A) 05/22/2017 0956   APPEARANCEUR Clear 07/01/2020 1455   LABSPEC 1.019  05/22/2017 0956   PHURINE 6.0 05/22/2017 0956   GLUCOSEU Negative 07/01/2020 1455   HGBUR NEGATIVE 05/22/2017 0956   BILIRUBINUR Negative 07/01/2020 1455   KETONESUR NEGATIVE 05/22/2017 0956   PROTEINUR Negative 07/01/2020 1455   PROTEINUR NEGATIVE 05/22/2017 0956   NITRITE Negative 07/01/2020 1455   NITRITE NEGATIVE 05/22/2017 0956   LEUKOCYTESUR Negative 07/01/2020 1455    Pertinent Imaging:   Assessment & Plan: Continue with Gemtesa and see in a year  There are no diagnoses linked to this encounter.  No follow-ups on file.  07/03/2020, MD  [facility] [address], [address] [place], [place] [phone]

## 2021-05-17 ENCOUNTER — Other Ambulatory Visit: Payer: Self-pay

## 2021-05-17 ENCOUNTER — Ambulatory Visit
Admission: EM | Admit: 2021-05-17 | Discharge: 2021-05-17 | Disposition: A | Discharge status: Home / Self Care | Payer: Medicare HMO | Attending: Family Medicine | Admitting: Family Medicine

## 2021-05-17 ENCOUNTER — Encounter: Payer: Self-pay | Admitting: Emergency Medicine

## 2021-05-17 DIAGNOSIS — B349 Viral infection, unspecified: Secondary | ICD-10-CM

## 2021-05-17 DIAGNOSIS — Z1152 Encounter for screening for COVID-19: Secondary | ICD-10-CM | POA: Diagnosis not present

## 2021-05-17 MED ORDER — CETIRIZINE HCL 5 MG PO TABS: 5.0000 mg | ORAL_TABLET | Freq: Every day | ORAL | 0 refills | Status: DC

## 2021-05-17 MED ORDER — BENZONATATE 200 MG PO CAPS: 200.0000 mg | ORAL_CAPSULE | Freq: Three times a day (TID) | ORAL | 0 refills | Status: DC

## 2021-05-17 NOTE — ED Provider Notes (Signed)
Erica Donaldson    CSN: [id number] Arrival date & time: 05/17/21  0805      History   Chief Complaint Chief Complaint  Patient presents with   Sore Throat   Generalized Body Aches   Cough   Headache   Fever    HPI Erica Donaldson is a 81 y.o. female.   HPI Patient presents for COVID testing due to close household exposure. Patient complains of one day history of sore throat, cough, headache, fever and body aches. She has a low grade temperature on arrival. Denies SOB, weakness, or chest pain. She has taken tylenol for fever and body aches. Fever TMAX 100.1 x 1 day.  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 Authorizing Provider  benzonatate (TESSALON) 200 MG capsule Take 1 capsule (200 mg total) by mouth every 8 (eight) hours. 05/17/21  Yes Bing Neighbors, FNP  cetirizine (ZYRTEC) 5 MG tablet Take 1 tablet (5 mg total) by mouth daily. 05/17/21  Yes Bing Neighbors, FNP  acetaminophen (TYLENOL) 500 MG tablet Take by mouth.    [provider]  atorvastatin (LIPITOR) 10 MG tablet  04/10/18   [provider]  Hormone Cream Base CREA Apply Topically once daily prn 02/10/21   Nadara Mustard, MD  levothyroxine (SYNTHROID) 25 MCG tablet  Take by mouth. 05/11/17   [provider]  metoprolol succinate (TOPROL-XL) 50 MG 24 hr tablet Take 1 tablet by mouth 2 (two) times daily. 10/09/20   [provider]  omeprazole (PRILOSEC) 20 MG capsule TAKE 1 CAPSULE DAILY 08/04/19   [provider]  Vaginal Lubricant (REPLENS) GEL Place 1 Applicatorful vaginally 2 (two) times a week. 05/08/19   Nadara Mustard, MD  Vibegron 75 MG TABS Take 75 mg by mouth daily. 11/04/20   Alfredo Martinez, MD    Family History Family History  Problem Relation Age of Onset   Cancer Brother    Lung cancer Brother    Cancer Maternal Aunt    Breast cancer Maternal Aunt    Cancer Paternal Grandmother    Uterine cancer Paternal Grandmother     Social History Social History   Tobacco Use   Smoking status: Never   Smokeless tobacco: Never  Vaping Use   Vaping Use: Never used  Substance Use Topics   Alcohol use: No   Drug use: No     Allergies  Sulfa antibiotics   Review of Systems Review of Systems Pertinent negatives listed in HPI   Physical Exam Triage Vital Signs ED Triage Vitals  Enc Vitals Group     BP      Pulse      Resp      Temp      Temp src      SpO2      Weight      Height      Head Circumference      Peak Flow      Pain Score      Pain Loc      Pain Edu?      Excl. in GC?    No data found.  Updated Vital Signs BP 115/74 (BP Location: Left Arm)    Pulse 82    Temp 99.5 F (37.5 C) (Oral)    Resp 16    SpO2 97%   Visual Acuity Right Eye Distance:   Left Eye Distance:   Bilateral Distance:    Right Eye Near:   Left Eye Near:    Bilateral Near:     Physical Exam   General Appearance:    Alert, acutely ill appearing, cooperative, no distress  HENT:   Normocephalic, ears normal, nares mucosal edema with congestion, rhinorrhea, oropharynx erythematous with swelling  Eyes:    PERRL, conjunctiva/corneas clear, EOM's intact       Lungs:     Clear to auscultation bilaterally,  respirations unlabored  Heart:    Regular rate and rhythm  Neurologic:   Awake, alert, oriented x 3. No apparent focal neurological           defect.     UC Treatments / Results  Labs (all labs ordered are listed, but only abnormal results are displayed) Labs Reviewed  NOVEL CORONAVIRUS, NAA    EKG   Radiology No results found.  Procedures Procedures (including critical care time)  Medications Ordered in UC Medications - No data to display  Initial Impression / Assessment and Plan / UC Course  I have reviewed the triage vital signs and the nursing notes.  Pertinent labs & imaging results that were available during my care of the patient were reviewed by me and considered in my medical decision making (see chart for details).     Patient would like antiviral therapy if COVID test is positive,. Based on labs GFR 69, patient will qualify for Paxlovid however will need to stop Atorvastatin for 7 days (5 days while on medication and 3 days following therapy  Final Clinical Impressions(s) / UC Diagnoses   Final diagnoses:  Encounter for screening for COVID-19  Viral illness     Discharge Instructions      COVID-19 test pending. Symptom management warranted only.  Manage fever and body aches with ibuprofen.  Nasal symptoms with antihistamines recommended.  Treatment per discharge medications/discharge instructions.  Red flags/ER precautions given. The most current CDC isolation/quarantine recommendation advised.       ED Prescriptions     Medication Sig Dispense Auth. Provider   cetirizine (ZYRTEC) 5 MG tablet Take 1 tablet (5 mg total) by mouth daily. 20 tablet Bing Neighbors, FNP   benzonatate (TESSALON) 200 MG capsule Take 1 capsule (200 mg total) by mouth every 8 (eight) hours. 30 capsule Bing Neighbors, FNP      PDMP not reviewed this encounter.   Bing Neighbors, [place] 05/17/21 [phone]

## 2021-05-17 NOTE — ED Triage Notes (Signed)
Pt presents with ST, cough, HA, fever and bodyaches sxs started yesterday. Her husband tested positive for Covid on 05/14/21

## 2021-05-17 NOTE — Discharge Instructions (Addendum)
COVID-19 test pending. Symptom management warranted only.  Manage fever and body aches with ibuprofen.  Nasal symptoms with antihistamines recommended.  Treatment per discharge medications/discharge instructions.  Red flags/ER precautions given. The most current CDC isolation/quarantine recommendation advised.

## 2021-05-18 LAB — SARS-COV-2, NAA 2 DAY TAT

## 2021-05-18 LAB — NOVEL CORONAVIRUS, NAA: SARS-CoV-2, NAA: DETECTED — AB

## 2021-05-19 ENCOUNTER — Telehealth (HOSPITAL_COMMUNITY): Payer: Self-pay | Admitting: Emergency Medicine

## 2021-05-19 MED ORDER — NIRMATRELVIR/RITONAVIR (PAXLOVID)TABLET: 3.0000 | ORAL_TABLET | Freq: Two times a day (BID) | ORAL | 0 refills | Status: DC

## 2021-05-19 MED ORDER — NIRMATRELVIR/RITONAVIR (PAXLOVID)TABLET: 3.0000 | ORAL_TABLET | Freq: Two times a day (BID) | ORAL | 0 refills | Status: AC

## 2021-06-18 ENCOUNTER — Ambulatory Visit (INDEPENDENT_AMBULATORY_CARE_PROVIDER_SITE_OTHER): Payer: Medicare HMO | Admitting: Obstetrics & Gynecology

## 2021-06-18 ENCOUNTER — Encounter: Payer: Self-pay | Admitting: Obstetrics & Gynecology

## 2021-06-18 ENCOUNTER — Other Ambulatory Visit: Payer: Self-pay

## 2021-06-18 VITALS — BP 120/80 | Ht 63.0 in | Wt 130.0 lb

## 2021-06-18 DIAGNOSIS — N952 Postmenopausal atrophic vaginitis: Secondary | ICD-10-CM | POA: Diagnosis not present

## 2021-06-18 DIAGNOSIS — Z90710 Acquired absence of both cervix and uterus: Secondary | ICD-10-CM | POA: Diagnosis not present

## 2021-06-18 DIAGNOSIS — Z1211 Encounter for screening for malignant neoplasm of colon: Secondary | ICD-10-CM

## 2021-06-18 DIAGNOSIS — N3281 Overactive bladder: Secondary | ICD-10-CM

## 2021-06-18 DIAGNOSIS — Z01419 Encounter for gynecological examination (general) (routine) without abnormal findings: Secondary | ICD-10-CM | POA: Diagnosis not present

## 2021-06-18 MED ORDER — NA SULFATE-K SULFATE-MG SULF 17.5-3.13-1.6 GM/177ML PO SOLN: 1.0000 | Freq: Once | ORAL | 0 refills | Status: AC

## 2021-06-18 NOTE — Progress Notes (Signed)
?HPI: ?     Ms. GLORIANNE PROCTOR is a 81 y.o. G1P0 who LMP was in the past, she presents today for her annual examination.  The patient has no complaints today. The patient is not sexually active. Herlast pap: was normal and last mammogram: was normal.  The patient does perform self breast exams.  There is no notable family history of breast or ovarian cancer in her family. The patient is not taking hormone replacement therapy. Patient denies post-menopausal vaginal bleeding.   The patient has regular exercise: yes. The patient denies current symptoms of depression.   ? ?GYN Hx: ?Last Colonoscopy:10 years ago. Normal.  ?Last DEXA: 3 years ago.   ? ?PMHx: ?Past Medical History:  ?Diagnosis Date  ?? Overactive bladder   ?? Thyroid disease   ? ?Past Surgical History:  ?Procedure Laterality Date  ?? ABDOMINAL HYSTERECTOMY    ?? APPENDECTOMY    ?? BREAST SURGERY    ? ?Family History  ?Problem Relation Age of Onset  ?? Cancer Brother   ?? Lung cancer Brother   ?? Cancer Maternal Aunt   ?? Breast cancer Maternal Aunt   ?? Cancer Paternal Grandmother   ?? Uterine cancer Paternal Grandmother   ? ?Social History  ? ?Tobacco Use  ?? Smoking status: Never  ?? Smokeless tobacco: Never  ?Vaping Use  ?? Vaping Use: Never used  ?Substance Use Topics  ?? Alcohol use: No  ?? Drug use: No  ? ? ?Current Outpatient Medications:  ??  acetaminophen (TYLENOL) 500 MG tablet, Take by mouth., Disp: , Rfl:  ??  atorvastatin (LIPITOR) 10 MG tablet, , Disp: , Rfl:  ??  benzonatate (TESSALON) 200 MG capsule, Take 1 capsule (200 mg total) by mouth every 8 (eight) hours., Disp: 30 capsule, Rfl: 0 ??  cetirizine (ZYRTEC) 5 MG tablet, Take 1 tablet (5 mg total) by mouth daily., Disp: 20 tablet, Rfl: 0 ??  Hormone Cream Base CREA, Apply Topically once daily prn, Disp: 30 g, Rfl: 11 ??  levothyroxine (SYNTHROID) 25 MCG tablet, Take by mouth., Disp: , Rfl:  ??  metoprolol succinate (TOPROL-XL) 50 MG 24 hr tablet, Take 1 tablet by mouth 2 (two) times  daily., Disp: , Rfl:  ??  omeprazole (PRILOSEC) 20 MG capsule, TAKE 1 CAPSULE DAILY, Disp: , Rfl:  ??  Vaginal Lubricant (REPLENS) GEL, Place 1 Applicatorful vaginally 2 (two) times a week., Disp: 35 g, Rfl: 11 ??  Vibegron 75 MG TABS, Take 75 mg by mouth daily., Disp: 90 tablet, Rfl: 3 ?Allergies: Sulfa antibiotics ? ?Review of Systems  ?Constitutional:  Negative for chills, fever and malaise/fatigue.  ?HENT:  Negative for congestion, sinus pain and sore throat.   ?Eyes:  Negative for blurred vision and pain.  ?Respiratory:  Negative for cough and wheezing.   ?Cardiovascular:  Negative for chest pain and leg swelling.  ?Gastrointestinal:  Negative for abdominal pain, constipation, diarrhea, heartburn, nausea and vomiting.  ?Genitourinary:  Negative for dysuria, frequency, hematuria and urgency.  ?Musculoskeletal:  Negative for back pain, joint pain, myalgias and neck pain.  ?Skin:  Negative for itching and rash.  ?Neurological:  Negative for dizziness, tremors and weakness.  ?Endo/Heme/Allergies:  Does not bruise/bleed easily.  ?Psychiatric/Behavioral:  Negative for depression. The patient is not nervous/anxious and does not have insomnia.   ? ?Objective: ?BP 120/80   Ht 5\' 3"  (1.6 m)   Wt 130 lb (59 kg)   BMI 23.03 kg/m?   ?Filed Weights  ? 06/18/21 1308  ?  Weight: 130 lb (59 kg)  ? Body mass index is 23.03 kg/m?Marland Kitchen ?Physical Exam ?Constitutional:   ?   General: She is not in acute distress. ?   Appearance: She is well-developed.  ?Genitourinary:  ?   Vulva, bladder, rectum and urethral meatus normal.  ?   No lesions in the vagina.  ?   Genitourinary Comments: Vaginal cuff well healed  ?   Right Labia: No rash, tenderness or lesions. ?   Left Labia: No tenderness, lesions or rash. ?   No vaginal bleeding.  ? ?   Right Adnexa: not tender and no mass present. ?   Left Adnexa: not tender and no mass present. ?   Cervix is absent.  ?   Uterus is absent.  ?   Pelvic exam was performed with patient in the lithotomy  position.  ?Breasts: ?   Right: No mass, skin change or tenderness.  ?   Left: No mass, skin change or tenderness.  ?HENT:  ?   Head: Normocephalic and atraumatic. No laceration.  ?   Right Ear: Hearing normal.  ?   Left Ear: Hearing normal.  ?   Mouth/Throat:  ?   Pharynx: Uvula midline.  ?Eyes:  ?   Pupils: Pupils are equal, round, and reactive to light.  ?Neck:  ?   Thyroid: No thyromegaly.  ?Cardiovascular:  ?   Rate and Rhythm: Normal rate and regular rhythm.  ?   Heart sounds: No murmur heard. ?  No friction rub. No gallop.  ?Pulmonary:  ?   Effort: Pulmonary effort is normal. No respiratory distress.  ?   Breath sounds: Normal breath sounds. No wheezing.  ?Abdominal:  ?   General: Bowel sounds are normal. There is no distension.  ?   Palpations: Abdomen is soft.  ?   Tenderness: There is no abdominal tenderness. There is no rebound.  ?Musculoskeletal:     ?   General: Normal range of motion.  ?   Cervical back: Normal range of motion and neck supple.  ?Neurological:  ?   Mental Status: She is alert and oriented to person, place, and time.  ?   Cranial Nerves: No cranial nerve deficit.  ?Skin: ?   General: Skin is warm and dry.  ?Psychiatric:     ?   Judgment: Judgment normal.  ?Vitals reviewed.  ? ? ?Assessment: Annual Exam ?1. Women's annual routine gynecological examination   ?2. History of hysterectomy   ?3. Vaginal atrophy   ?4. Detrusor instability of bladder   ?5. Screen for colon cancer   ? ? ?Plan: ?           ?1.  Vaginal Screening-  PAP no longer needed ?(Prior hyst) ? ?2. Breast screening- Exam annually and mammogram scheduled ? ?3. Colonoscopy recommended one more time, to see gastroenterology ? ?4. Labs managed by PCP ? ?5. Counseling for hormonal therapy: none ? ?            6. FRAX - FRAX score for assessing the 10 year probability for fracture calculated and discussed today.  Based on age and score today, DEXA is not scheduled.  Considering soon, PCP always orders for her. ? ? 7. OAB, cont med  therapy and she sees urology for this ? ? 8. Vag atrophy, denies sx's.  No longer using Replens. ?No longer using testosterone ? ?  F/U ? Return in about 1 year (around 06/19/2022) for and Annual when due. ? ?Renae Fickle  Tiburcio PeaHarris, MD, FACOG ?[facility], [facility] ?06/18/2021  1:37 PM ? ? ?

## 2021-06-18 NOTE — Progress Notes (Signed)
HPI:      Ms. LAKERIA KNABEL is a 81 y.o. G1P0 who LMP was in the past, she presents today for her annual examination.  The patient has no complaints today. The patient is not sexually active. Herlast pap: was normal and last mammogram: was normal.  The patient does perform self breast exams.  There is no notable family history of breast or ovarian cancer in her family. The patient is not taking hormone replacement therapy. Patient denies post-menopausal vaginal bleeding.   The patient has regular exercise: yes. The patient denies current symptoms of depression.    GYN Hx: Last Colonoscopy:10 years ago. Normal.  Last DEXA: 3 years ago.    PMHx: Past Medical History:  Diagnosis Date   Overactive bladder    Thyroid disease    Past Surgical History:  Procedure Laterality Date   ABDOMINAL HYSTERECTOMY     APPENDECTOMY     BREAST SURGERY     Family History  Problem Relation Age of Onset   Cancer Brother    Lung cancer Brother    Cancer Maternal Aunt    Breast cancer Maternal Aunt    Cancer Paternal Grandmother    Uterine cancer Paternal Grandmother    Social History   Tobacco Use   Smoking status: Never   Smokeless tobacco: Never  Vaping Use   Vaping Use: Never used  Substance Use Topics   Alcohol use: No   Drug use: No    Current Outpatient Medications:    acetaminophen (TYLENOL) 500 MG tablet, Take by mouth., Disp: , Rfl:    atorvastatin (LIPITOR) 10 MG tablet, , Disp: , Rfl:    benzonatate (TESSALON) 200 MG capsule, Take 1 capsule (200 mg total) by mouth every 8 (eight) hours., Disp: 30 capsule, Rfl: 0   cetirizine (ZYRTEC) 5 MG tablet, Take 1 tablet (5 mg total) by mouth daily., Disp: 20 tablet, Rfl: 0   Hormone Cream Base CREA, Apply Topically once daily prn, Disp: 30 g, Rfl: 11   levothyroxine (SYNTHROID) 25 MCG tablet, Take by mouth., Disp: , Rfl:    metoprolol succinate (TOPROL-XL) 50 MG 24 hr tablet, Take 1 tablet by mouth 2 (two) times  daily., Disp: , Rfl:    omeprazole (PRILOSEC) 20 MG capsule, TAKE 1 CAPSULE DAILY, Disp: , Rfl:    Vaginal Lubricant (REPLENS) GEL, Place 1 Applicatorful vaginally 2 (two) times a week., Disp: 35 g, Rfl: 11   Vibegron 75 MG TABS, Take 75 mg by mouth daily., Disp: 90 tablet, Rfl: 3 Allergies: Sulfa antibiotics  Review of Systems  Constitutional:  Negative for chills, fever and malaise/fatigue.  HENT:  Negative for congestion, sinus pain and sore throat.   Eyes:  Negative for blurred vision and pain.  Respiratory:  Negative for cough and wheezing.   Cardiovascular:  Negative for chest pain and leg swelling.  Gastrointestinal:  Negative for abdominal pain, constipation, diarrhea, heartburn, nausea and vomiting.  Genitourinary:  Negative for dysuria, frequency, hematuria and urgency.  Musculoskeletal:  Negative for back pain, joint pain, myalgias and neck pain.  Skin:  Negative for itching and rash.  Neurological:  Negative for dizziness, tremors and weakness.  Endo/Heme/Allergies:  Does not bruise/bleed easily.  Psychiatric/Behavioral:  Negative for depression. The patient is not nervous/anxious and does not have insomnia.    Objective: BP 120/80    Ht 5\' 3"  (1.6 m)    Wt 130 lb (59 kg)    BMI 23.03 kg/m   [employer]  06/18/21 1308  Weight: 130 lb (59 kg)   Body mass index is 23.03 kg/m. Physical Exam Constitutional:      General: She is not in acute distress.    Appearance: She is well-developed.  Genitourinary:     Vulva, bladder, rectum and urethral meatus normal.     No lesions in the vagina.     Genitourinary Comments: Vaginal cuff well healed     Right Labia: No rash, tenderness or lesions.    Left Labia: No tenderness, lesions or rash.    No vaginal bleeding.      Right Adnexa: not tender and no mass present.    Left Adnexa: not tender and no mass present.    Cervix is absent.     Uterus is absent.     Pelvic exam was performed with patient in the lithotomy  position.  Breasts:    Right: No mass, skin change or tenderness.     Left: No mass, skin change or tenderness.  HENT:     Head: Normocephalic and atraumatic. No laceration.     Right Ear: Hearing normal.     Left Ear: Hearing normal.     Mouth/Throat:     Pharynx: Uvula midline.  Eyes:     Pupils: Pupils are equal, round, and reactive to light.  Neck:     Thyroid: No thyromegaly.  Cardiovascular:     Rate and Rhythm: Normal rate and regular rhythm.     Heart sounds: No murmur heard.   No friction rub. No gallop.  Pulmonary:     Effort: Pulmonary effort is normal. No respiratory distress.     Breath sounds: Normal breath sounds. No wheezing.  Abdominal:     General: Bowel sounds are normal. There is no distension.     Palpations: Abdomen is soft.     Tenderness: There is no abdominal tenderness. There is no rebound.  Musculoskeletal:        General: Normal range of motion.     Cervical back: Normal range of motion and neck supple.  Neurological:     Mental Status: She is alert and oriented to person, place, and time.     Cranial Nerves: No cranial nerve deficit.  Skin:    General: Skin is warm and dry.  Psychiatric:        Judgment: Judgment normal.  Vitals reviewed.    Assessment: Annual Exam 1. Women's annual routine gynecological examination   2. History of hysterectomy   3. Vaginal atrophy   4. Detrusor instability of bladder   5. Screen for colon cancer     Plan:            1.  Vaginal Screening-  PAP no longer needed (Prior hyst)  2. Breast screening- Exam annually and mammogram scheduled  3. Colonoscopy recommended one more time, to see gastroenterology  4. Labs managed by PCP  5. Counseling for hormonal therapy: none              6. FRAX - FRAX score for assessing the 10 year probability for fracture calculated and discussed today.  Based on age and score today, DEXA is not scheduled.  Considering soon, PCP always orders for her.   7. OAB, cont med  therapy and she sees urology for this   8. Vag atrophy, denies sx's.  No longer using Replens. No longer using testosterone    F/U  Return in about 1 year (around 06/19/2022) for and Annual when  due.  Barnett Applebaum, MD, [facility], [facility] 06/18/2021  1:37 PM

## 2021-06-18 NOTE — Progress Notes (Signed)
Gastroenterology Pre-Procedure Review ? ?Request Date: 07/10/2021 ?Requesting Physician: Dr. Vicente Males ? ?PATIENT REVIEW QUESTIONS: The patient responded to the following health history questions as indicated:   ? ?1. Are you having any GI issues? no ?2. Do you have a personal history of Polyps? no ?3. Do you have a family history of Colon Cancer or Polyps? yes (brother polyp) ?4. Diabetes Mellitus? no ?5. Joint replacements in the past 12 months?no ?6. Major health problems in the past 3 months?no ?7. Any artificial heart valves, MVP, or defibrillator?no ?   ?MEDICATIONS & ALLERGIES:    ?Patient reports the following regarding taking any anticoagulation/antiplatelet therapy:   ?Plavix, Coumadin, Eliquis, Xarelto, Lovenox, Pradaxa, Brilinta, or Effient? no ?Aspirin? 81 mg ? ?Patient confirms/reports the following medications:  ?Current Outpatient Medications  ?Medication Sig Dispense Refill  ? acetaminophen (TYLENOL) 500 MG tablet Take by mouth.    ? atorvastatin (LIPITOR) 10 MG tablet     ? benzonatate (TESSALON) 200 MG capsule Take 1 capsule (200 mg total) by mouth every 8 (eight) hours. 30 capsule 0  ? cetirizine (ZYRTEC) 5 MG tablet Take 1 tablet (5 mg total) by mouth daily. 20 tablet 0  ? Hormone Cream Base CREA Apply Topically once daily prn 30 g 11  ? levothyroxine (SYNTHROID) 25 MCG tablet Take by mouth.    ? metoprolol succinate (TOPROL-XL) 50 MG 24 hr tablet Take 1 tablet by mouth 2 (two) times daily.    ? omeprazole (PRILOSEC) 20 MG capsule TAKE 1 CAPSULE DAILY    ? Vaginal Lubricant (REPLENS) GEL Place 1 Applicatorful vaginally 2 (two) times a week. 35 g 11  ? Vibegron 75 MG TABS Take 75 mg by mouth daily. 90 tablet 3  ? ?No current facility-administered medications for this visit.  ? ? ?Patient confirms/reports the following allergies:  ?Allergies  ?Allergen Reactions  ? Sulfa Antibiotics Rash  ? ? ?No orders of the defined types were placed in this encounter. ? ? ?AUTHORIZATION INFORMATION ?Primary  Insurance: ?1D#: ?Group #: ? ?Secondary Insurance: ?1D#: ?Group #: ? ?SCHEDULE INFORMATION: ?Date: 07/10/2021 ?Time: ?Location:[facility] ? ?

## 2021-06-24 ENCOUNTER — Ambulatory Visit
Admission: RE | Admit: 2021-06-24 | Discharge: 2021-06-24 | Disposition: A | Discharge status: Home / Self Care | Payer: Medicare HMO | Source: Ambulatory Visit | Attending: Obstetrics & Gynecology | Admitting: Obstetrics & Gynecology

## 2021-06-24 ENCOUNTER — Other Ambulatory Visit: Payer: Self-pay

## 2021-06-24 DIAGNOSIS — Z1231 Encounter for screening mammogram for malignant neoplasm of breast: Secondary | ICD-10-CM | POA: Diagnosis not present

## 2021-06-24 DIAGNOSIS — Z1239 Encounter for other screening for malignant neoplasm of breast: Secondary | ICD-10-CM

## 2021-07-09 ENCOUNTER — Encounter: Payer: Self-pay | Admitting: Gastroenterology

## 2021-07-10 ENCOUNTER — Encounter
Admission: RE | Disposition: A | Discharge status: Home / Self Care | Payer: Self-pay | Source: Home / Self Care | Attending: Gastroenterology

## 2021-07-10 ENCOUNTER — Ambulatory Visit: Payer: Medicare HMO | Admitting: Anesthesiology

## 2021-07-10 ENCOUNTER — Encounter: Payer: Self-pay | Admitting: Gastroenterology

## 2021-07-10 ENCOUNTER — Ambulatory Visit
Admission: RE | Admit: 2021-07-10 | Discharge: 2021-07-10 | Disposition: A | Discharge status: Home / Self Care | Payer: Medicare HMO | Attending: Gastroenterology | Admitting: Gastroenterology

## 2021-07-10 DIAGNOSIS — Z539 Procedure and treatment not carried out, unspecified reason: Secondary | ICD-10-CM | POA: Diagnosis not present

## 2021-07-10 DIAGNOSIS — Z1211 Encounter for screening for malignant neoplasm of colon: Secondary | ICD-10-CM | POA: Diagnosis not present

## 2021-07-10 HISTORY — DX: Cardiac arrhythmia, unspecified: I49.9

## 2021-07-10 SURGERY — COLONOSCOPY WITH PROPOFOL
Anesthesia: General

## 2021-07-10 MED ORDER — LIDOCAINE HCL (PF) 2 % IJ SOLN
INTRAMUSCULAR | Status: AC
  Filled 2021-07-10: qty 5

## 2021-07-10 MED ORDER — SODIUM CHLORIDE 0.9 % IV SOLN: INTRAVENOUS | Status: DC

## 2021-07-10 MED ORDER — PROPOFOL 10 MG/ML IV BOLUS
INTRAVENOUS | Status: AC
  Filled 2021-07-10: qty 20

## 2021-07-10 NOTE — Anesthesia Preprocedure Evaluation (Deleted)
Anesthesia Evaluation  ?Patient identified by MRN, date of birth, ID band ?Patient awake ? ? ? ?Reviewed: ?Allergy & Precautions, NPO status , Patient's Chart, lab work & pertinent test results ? ?Airway ?Mallampati: II ? ?TM Distance: >3 FB ?Neck ROM: Full ? ? ? Dental ? ?(+) Teeth Intact ?  ?Pulmonary ?neg pulmonary ROS,  ?  ?Pulmonary exam normal ?breath sounds clear to auscultation ? ? ? ? ? ? Cardiovascular ?Exercise Tolerance: Good ?hypertension, negative cardio ROS ?Normal cardiovascular exam ?Rhythm:Regular Rate:Normal ? ? ?  ?Neuro/Psych ?negative neurological ROS ? negative psych ROS  ? GI/Hepatic ?negative GI ROS, Neg liver ROS,   ?Endo/Other  ?negative endocrine ROS ? Renal/GU ?negative Renal ROS  ?negative genitourinary ?  ?Musculoskeletal ? ? Abdominal ?Normal abdominal exam  (+)   ?Peds ?negative pediatric ROS ?(+)  Hematology ?negative hematology ROS ?(+)   ?Anesthesia Other Findings ?Past Medical History: ?No date: Dysrhythmia ?No date: Overactive bladder ?No date: Thyroid disease ? ?Past Surgical History: ?No date: ABDOMINAL HYSTERECTOMY ?No date: APPENDECTOMY ?No date: BREAST EXCISIONAL BIOPSY; Right ?    Comment:  1960's 2 areas excised ? ?BMI   ? Body Mass Index: 22.32 kg/m?  ?  ? ? Reproductive/Obstetrics ?negative OB ROS ? ?  ? ? ? ? ? ? ? ? ? ? ? ? ? ?  ?  ? ? ? ? ? ? ? ? ?Anesthesia Physical ?Anesthesia Plan ? ?ASA: 2 ? ?Anesthesia Plan: General  ? ?Post-op Pain Management:   ? ?Induction: Intravenous ? ?PONV Risk Score and Plan: Propofol infusion and TIVA ? ?Airway Management Planned: Natural Airway and Nasal Cannula ? ?Additional Equipment:  ? ?Intra-op Plan:  ? ?Post-operative Plan:  ? ?Informed Consent: I have reviewed the patients History and Physical, chart, labs and discussed the procedure including the risks, benefits and alternatives for the proposed anesthesia with the patient or authorized representative who has indicated his/her understanding and  acceptance.  ? ? ? ?Dental Advisory Given ? ?Plan Discussed with: CRNA and Surgeon ? ?Anesthesia Plan Comments:   ? ? ? ? ? ? ?Anesthesia Quick Evaluation ? ?

## 2021-07-10 NOTE — H&P (Signed)
Patient came in for a screening colonoscopy , has had 2 prior which were negative, no other symptoms. I explained to her that after the age of 60 the procedure is not usually done unless the patient absolutely would like to proceed with it as the risks rise with age related to anesthesia and the procedure. She decided to cancel the procedure ? ?Dr Wyline Mood MD,MRCP [facility]) ?Gastroenterology/Hepatology ?Pager: [phone] ? ?

## 2021-07-10 NOTE — Op Note (Addendum)
[facility] ?Gastroenterology ?Patient Name: Erica Donaldson ?Procedure Date: 07/10/2021 9:50 AM ?MRN: [id number] ?Account #: [id number] ?Date of Birth: [birth year] ?Admit Type: Outpatient ?Age: 81 ?Room: [facility] ENDO ROOM 3 ?Gender: Female ?Note Status: Finalized ?Instrument Name: Colonoscope [id number] ?THIS EXAM WAS SENT IN ERROR ?

## 2021-07-25 ENCOUNTER — Telehealth: Payer: Self-pay

## 2021-07-25 ENCOUNTER — Other Ambulatory Visit: Payer: Self-pay

## 2021-07-25 MED ORDER — HORMONE CREAM BASE CREA: TOPICAL_CREAM | 11 refills | Status: DC

## 2021-07-25 NOTE — Telephone Encounter (Signed)
Pt wanting a refill on her hormone cream. Does [facility] have you refill?  ?

## 2021-07-25 NOTE — Telephone Encounter (Signed)
I sent refill to [facility].

## 2021-07-25 NOTE — Telephone Encounter (Signed)
Refilled same as last time and pt aware  ?

## 2022-02-20 ENCOUNTER — Ambulatory Visit (INDEPENDENT_AMBULATORY_CARE_PROVIDER_SITE_OTHER): Payer: Medicare HMO | Admitting: Physician Assistant

## 2022-02-20 ENCOUNTER — Encounter: Payer: Self-pay | Admitting: Physician Assistant

## 2022-02-20 VITALS — Ht 63.0 in | Wt 126.0 lb

## 2022-02-20 DIAGNOSIS — N3946 Mixed incontinence: Secondary | ICD-10-CM

## 2022-02-20 DIAGNOSIS — N958 Other specified menopausal and perimenopausal disorders: Secondary | ICD-10-CM | POA: Diagnosis not present

## 2022-02-20 LAB — URINALYSIS, COMPLETE
Bilirubin, UA: NEGATIVE
Glucose, UA: NEGATIVE
Ketones, UA: NEGATIVE
Leukocytes,UA: NEGATIVE
Nitrite, UA: NEGATIVE
Protein,UA: NEGATIVE
Specific Gravity, UA: 1.01 (ref 1.005–1.030)
Urobilinogen, Ur: 0.2 mg/dL (ref 0.2–1.0)
pH, UA: 7 (ref 5.0–7.5)

## 2022-02-20 LAB — MICROSCOPIC EXAMINATION: Bacteria, UA: NONE SEEN

## 2022-02-20 MED ORDER — ESTRADIOL 0.1 MG/GM VA CREA: TOPICAL_CREAM | VAGINAL | 12 refills | Status: DC

## 2022-02-20 NOTE — Patient Instructions (Signed)
Please start vaginal estrogen cream. On days you are not using it, you may also try the over-the-counter nonhormonal vaginal moisturizer called Replens.

## 2022-02-20 NOTE — Progress Notes (Signed)
02/20/2022 2:39 PM   Erica Donaldson 1940-09-23 [id number]  CC: Chief Complaint  Patient presents with   Dysuria   HPI: Erica Donaldson is a 81 y.o. female with PMH of OAB wet with mixed urge and stress incontinence who takes [place] intermittently who presents today for evaluation of possible UTI.   Today she reports 2 weeks of burning discomfort and urethral meatus discomfort as well as labial discomfort. She denies vulvovaginal itching or discharge.  She also requests Gemtesa samples.  In-office UA today positive for trace lysed blood; urine microscopy pan negative.  PMH: Past Medical History:  Diagnosis Date   Dysrhythmia    Overactive bladder    Thyroid disease     Surgical History: Past Surgical History:  Procedure Laterality Date   ABDOMINAL HYSTERECTOMY     APPENDECTOMY     BREAST EXCISIONAL BIOPSY Right    1960's 2 areas excised    Home Medications:  Allergies as of 02/20/2022       Reactions   Sulfa Antibiotics Rash        Medication List        Accurate as of February 20, 2022  2:39 PM. If you have any questions, ask your nurse or doctor.          STOP taking these medications    benzonatate 200 MG capsule Commonly known as: TESSALON Stopped by: Carman Ching, PA-C   cetirizine 5 MG tablet Commonly known as: ZYRTEC Stopped by: Carman Ching, PA-C   Replens Gel Stopped by: Carman Ching, PA-C       TAKE these medications    acetaminophen 500 MG tablet Commonly known as: TYLENOL Take by mouth.   atorvastatin 10 MG tablet Commonly known as: LIPITOR   Hormone Cream Base Crea Apply Topically once daily prn   levothyroxine 25 MCG tablet Commonly known as: SYNTHROID Take by mouth.   metoprolol succinate 50 MG 24 hr tablet Commonly known as: TOPROL-XL Take 1 tablet by mouth 2 (two) times daily.   omeprazole 20 MG capsule Commonly known as: PRILOSEC TAKE 1 CAPSULE DAILY   Vibegron 75 MG  Tabs Take 75 mg by mouth daily. What changed:  when to take this additional instructions        Allergies:  Allergies  Allergen Reactions   Sulfa Antibiotics Rash    Family History: Family History  Problem Relation Age of Onset   Cancer Maternal Aunt    Breast cancer Maternal Aunt    Breast cancer Paternal Aunt    Cancer Paternal Grandmother    Uterine cancer Paternal Grandmother    Cancer Brother    Lung cancer Brother     Social History:   reports that she has never smoked. She has never used smokeless tobacco. She reports that she does not drink alcohol and does not use drugs.  Physical Exam: Ht 5\' 3"  (1.6 m)   Wt 126 lb (57.2 kg)   BMI 22.32 kg/m   Constitutional:  Alert and oriented, no acute distress, nontoxic appearing HEENT: [place], AT Cardiovascular: No clubbing, cyanosis, or edema Respiratory: Normal respiratory effort, no increased work of breathing GU: Pale labia minora.  Decreased vulvovaginal lubrication.  Grade 1 cystocele.  Urethral caruncle. Skin: No rashes, bruises or suspicious lesions Neurologic: Grossly intact, no focal deficits, moving all 4 extremities Psychiatric: Normal mood and affect  Laboratory Data: Results for orders placed or performed during the hospital encounter of 05/17/21  Novel Coronavirus, NAA ([facility])   Specimen:  Nasopharyngeal Swab; Nasopharyngeal(NP) swabs in vial transport medium   Nasopharynge  Release  Result Value Ref Range   SARS-CoV-2, NAA Detected (A) Not Detected  SARS-COV-2, NAA 2 DAY TAT   Nasopharynge  Release  Result Value Ref Range   SARS-CoV-2, NAA 2 DAY TAT Performed    Assessment & Plan:   1. Genitourinary syndrome of menopause UA today is bland.  Her dysuria and irritation are external and physical exam is consistent with vulvovaginal atrophy with urethral caruncle.  I recommended starting vaginal estrogen cream.  We discussed that there is not felt to be any significant systemic absorption of hormone  with this treatment and that there has been no data suggesting increased risk for breast cancer or breast cancer related deaths with topical vaginal estrogen cream.  She expressed understanding. - Urinalysis, Complete - estradiol (ESTRACE) 0.1 MG/GM vaginal cream; Apply one pea-sized amount around the opening of the urethra daily for 2 weeks, then 3 times weekly moving forward.  Dispense: 42.5 g; Refill: 12  2. Mixed incontinence Gemtesa samples provided today per patient request.  Return if symptoms worsen or fail to improve.  Carman Ching, PA-C  [facility] [address] [place], [place] [address] [phone]

## 2022-04-14 ENCOUNTER — Other Ambulatory Visit: Payer: Self-pay

## 2022-04-14 NOTE — Telephone Encounter (Signed)
[facility] sent in fax refill request for Testoster 5mg /ML HRT CMPD. Patients last office visit was 06/18/21 and prescription was last filled 07/25/2021. Please advise if appropriate to refill? KW

## 2022-04-15 NOTE — Telephone Encounter (Signed)
Can you approve this refill?

## 2022-04-16 ENCOUNTER — Telehealth: Payer: Self-pay

## 2022-04-16 ENCOUNTER — Other Ambulatory Visit: Payer: Self-pay | Admitting: Obstetrics & Gynecology

## 2022-04-16 DIAGNOSIS — N958 Other specified menopausal and perimenopausal disorders: Secondary | ICD-10-CM

## 2022-04-16 MED ORDER — HORMONE CREAM BASE CREA: TOPICAL_CREAM | 11 refills | Status: DC

## 2022-04-16 MED ORDER — ESTRADIOL 0.1 MG/GM VA CREA: TOPICAL_CREAM | VAGINAL | 12 refills | Status: DC

## 2022-04-16 NOTE — Telephone Encounter (Signed)
Katharine Look from [facility] in [place]; needs clarification on rx MCD sent; what hormone cream do they need to fill ; pt does get a couple different ones.  [phone]

## 2022-04-16 NOTE — Progress Notes (Signed)
Estradiol cream prescribed per patient request.

## 2022-04-16 NOTE — Telephone Encounter (Signed)
Called pt to make aware of refill of the Hormone cream sent in and she should use every other day. Pt aware.

## 2022-04-23 ENCOUNTER — Telehealth: Payer: Self-pay

## 2022-04-23 NOTE — Telephone Encounter (Signed)
Pharmacy called wanting clarification on the Hormone Cream Base. She wants to know a specific name. Is it her Testosterone Cream that you are wanting refilled. If so sig is: Apply 21ml once daily. HRT Compound 5mg /ml. Qty:30. Please advise

## 2022-05-18 ENCOUNTER — Ambulatory Visit: Payer: Medicare HMO | Admitting: Urology

## 2022-05-20 ENCOUNTER — Encounter: Payer: Self-pay | Admitting: Urology

## 2022-06-01 ENCOUNTER — Other Ambulatory Visit: Payer: Self-pay | Admitting: Obstetrics and Gynecology

## 2022-06-01 DIAGNOSIS — Z1231 Encounter for screening mammogram for malignant neoplasm of breast: Secondary | ICD-10-CM

## 2022-06-26 ENCOUNTER — Ambulatory Visit
Admission: RE | Admit: 2022-06-26 | Discharge: 2022-06-26 | Disposition: A | Discharge status: Home / Self Care | Payer: Medicare HMO | Source: Ambulatory Visit | Attending: Obstetrics and Gynecology | Admitting: Obstetrics and Gynecology

## 2022-06-26 DIAGNOSIS — Z1231 Encounter for screening mammogram for malignant neoplasm of breast: Secondary | ICD-10-CM | POA: Insufficient documentation

## 2022-07-30 ENCOUNTER — Telehealth: Payer: Self-pay

## 2022-07-30 NOTE — Telephone Encounter (Signed)
Spoke with Pharmacist. Advised Dr. Tiburcio Pea no longer with our practice. Dr. Marice Potter sent rx for Hormone Cream on 04/22/22 with refills. Patient has transferred care to [facility] and now sees Dr. Dalbert Garnet.

## 2022-12-16 ENCOUNTER — Ambulatory Visit (INDEPENDENT_AMBULATORY_CARE_PROVIDER_SITE_OTHER): Payer: Medicare HMO | Admitting: Physician Assistant

## 2022-12-16 ENCOUNTER — Encounter: Payer: Self-pay | Admitting: Physician Assistant

## 2022-12-16 VITALS — BP 135/77 | HR 76 | Wt 130.0 lb

## 2022-12-16 DIAGNOSIS — R3129 Other microscopic hematuria: Secondary | ICD-10-CM | POA: Diagnosis not present

## 2022-12-16 LAB — BLADDER SCAN AMB NON-IMAGING

## 2022-12-16 NOTE — Patient Instructions (Signed)

## 2022-12-16 NOTE — Progress Notes (Unsigned)
12/16/2022 2:21 PM   Erica Donaldson Nov 17, 1940 [id number]  CC: Chief Complaint  Patient presents with   urinary issues   HPI: Erica Donaldson is a 82 y.o. 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   Today she reports intermittent burning/pressure/rubbing sensations in her bladder area that are not associated with urination.  She is a never smoker with no known history of kidney stones.  She is retired and used to do office work Education administrator homes.  In-office UA today positive for 2+ blood; urine microscopy with >30 RBCs/HPF. PVR 13mL.  PMH: Past Medical History:  Diagnosis Date   Dysrhythmia    Overactive bladder    Thyroid disease     Surgical History: Past Surgical History:  Procedure Laterality Date   ABDOMINAL HYSTERECTOMY     APPENDECTOMY     BREAST EXCISIONAL BIOPSY Right    1960's 2 areas excised    Home Medications:  Allergies as of 12/16/2022       Reactions   Sulfa Antibiotics Rash        Medication List        Accurate as of December 16, 2022  2:21 PM. If you have any questions, ask your nurse or doctor.          acetaminophen 500 MG tablet Commonly known as: TYLENOL Take by mouth.   atorvastatin 10 MG tablet Commonly known as: LIPITOR   estradiol 0.1 MG/GM vaginal cream Commonly known as: ESTRACE Apply one pea-sized amount around the opening of the urethra daily for 2 weeks, then 3 times weekly moving forward.   Hormone Cream Base Crea Apply Topically once daily prn   levothyroxine 25 MCG tablet Commonly known as: SYNTHROID Take by mouth.   metoprolol succinate 50 MG 24 hr tablet Commonly known as: TOPROL-XL Take 1 tablet by mouth 2 (two) times daily.   omeprazole 20 MG capsule Commonly known as: PRILOSEC TAKE 1 CAPSULE DAILY   Vibegron 75 MG Tabs Take 75 mg by mouth daily. What changed:  when to take  this additional instructions        Allergies:  Allergies  Allergen Reactions   Sulfa Antibiotics Rash    Family History: Family History  Problem Relation Age of Onset   Cancer Maternal Aunt    Breast cancer Maternal Aunt    Breast cancer Paternal Aunt    Cancer Paternal Grandmother    Uterine cancer Paternal Grandmother    Cancer Brother    Lung cancer Brother     Social History:   reports that she has never smoked. She has never used smokeless tobacco. She reports that she does not drink alcohol and does not use drugs.  Physical Exam: BP 135/77   Pulse 76   Wt 130 lb (59 kg)   BMI 23.03 kg/m   Constitutional:  Alert and oriented, no acute distress, nontoxic appearing HEENT: [place], AT Cardiovascular: No clubbing, cyanosis, or edema Respiratory: Normal respiratory effort, no increased work of breathing Skin: No rashes, bruises or suspicious lesions Neurologic: Grossly intact, no focal deficits, moving all 4 extremities Psychiatric: Normal mood and affect  Laboratory Data: Results for orders placed or performed in visit on 12/16/22  Microscopic Examination   Urine  Result Value Ref Range   WBC, UA 0-5 0 - 5 /hpf   RBC, Urine >30 (A) 0 - 2 /hpf   Epithelial Cells (non  renal) 0-10 0 - 10 /hpf   Bacteria, UA Few None seen/Few  Urinalysis, Complete  Result Value Ref Range   Specific Gravity, UA 1.015 1.005 - 1.030   pH, UA 6.5 5.0 - 7.5   Color, UA Yellow Yellow   Appearance Ur Hazy (A) Clear   Leukocytes,UA Negative Negative   Protein,UA Negative Negative/Trace   Glucose, UA Negative Negative   Ketones, UA Negative Negative   RBC, UA 3+ (A) Negative   Bilirubin, UA Negative Negative   Urobilinogen, Ur 0.2 0.2 - 1.0 mg/dL   Nitrite, UA Negative Negative   Microscopic Examination See below:   Bladder Scan (Post Void Residual) in office  Result Value Ref Range   Scan Result 13ml    Assessment & Plan:   1. Microscopic hematuria New microscopic hematuria  and this never smoker with no apparent industrial exposures.  Intermittent bladder sensations may be related or due to underlying OAB.  I had a lengthy conversation today with the patient regarding the etiology of blood in the urine.  I explained that blood in the urine can be caused by a myriad of factors, including but not limited to infection, stones, cysts, anticoagulation, and urinary tract malignancies.  I explained that the recommended work-up for blood in the urine is twofold and includes a CT urogram for evaluation of the upper urinary tract including kidneys and ureters as well as a cystoscopy for evaluation of the urethra and bladder.  I explained that these two studies complement one another in reviewing the entire urinary tract for possible causes of bleeding.  I recommended that we proceed with this at this time.  Patient agreed; CTU ordered and follow-up cysto with CTU results scheduled.  - Urinalysis, Complete - Bladder Scan (Post Void Residual) in office - CT HEMATURIA WORKUP; Future - CULTURE, URINE COMPREHENSIVE  Return in about 4 weeks (around 01/13/2023) for Cystoscopy and CTU results with Dr. [name].  Carman Ching, PA-C  [facility] [place] [address] [place], [place] [address] [phone]

## 2022-12-17 LAB — MICROSCOPIC EXAMINATION: RBC, Urine: >30 /HPF — AB (ref 0–2)

## 2022-12-17 LAB — URINALYSIS, COMPLETE
Bilirubin, UA: NEGATIVE
Glucose, UA: NEGATIVE
Ketones, UA: NEGATIVE
Leukocytes,UA: NEGATIVE
Nitrite, UA: NEGATIVE
Protein,UA: NEGATIVE
Specific Gravity, UA: 1.015 (ref 1.005–1.030)
Urobilinogen, Ur: 0.2 mg/dL (ref 0.2–1.0)
pH, UA: 6.5 (ref 5.0–7.5)

## 2022-12-19 LAB — CULTURE, URINE COMPREHENSIVE

## 2022-12-22 ENCOUNTER — Ambulatory Visit
Admission: RE | Admit: 2022-12-22 | Discharge: 2022-12-22 | Disposition: A | Discharge status: Home / Self Care | Payer: Medicare HMO | Source: Ambulatory Visit | Attending: Physician Assistant | Admitting: Physician Assistant

## 2022-12-22 DIAGNOSIS — R3129 Other microscopic hematuria: Secondary | ICD-10-CM | POA: Diagnosis present

## 2022-12-22 MED ORDER — IOHEXOL 300 MG/ML  SOLN
100.0000 mL | Freq: Once | INTRAMUSCULAR | Status: AC | PRN
  Administered 2022-12-22: 100 mL via INTRAVENOUS

## 2022-12-22 MED ORDER — SODIUM CHLORIDE 0.9 % IV BOLUS
250.0000 mL | Freq: Once | INTRAVENOUS | Status: AC
  Administered 2022-12-22: 250 mL via INTRAVENOUS

## 2023-01-13 ENCOUNTER — Ambulatory Visit: Payer: Medicare HMO | Admitting: Urology

## 2023-01-13 VITALS — BP 156/83 | HR 79

## 2023-01-13 DIAGNOSIS — R3129 Other microscopic hematuria: Secondary | ICD-10-CM

## 2023-01-13 DIAGNOSIS — N3289 Other specified disorders of bladder: Secondary | ICD-10-CM

## 2023-01-13 NOTE — Progress Notes (Unsigned)
   01/13/23  CC:  Chief Complaint  Patient presents with   Cysto    HPI: 82 year old patient with a personal history mixed urinary incontinence followed by Dr. Sherron Monday who presents today for cystoscopic evaluation for microscopic hematuria/ chronic bladder pain/ dysuria.  She was a complete urogram which was negative for any GU  NED. A&Ox3.   No respiratory distress   Abd soft, NT, ND Normal external genitalia with patent urethral meatus  Cystoscopy Procedure Note  Patient identification was confirmed, informed consent was obtained, and patient was prepped using Betadine solution.  Lidocaine jelly was administered per urethral meatus.    Procedure: - Flexible cystoscope introduced, without any difficulty.   - Thorough search of the bladder revealed:    normal urethral meatus    normal urothelium    no stones    no ulcers     no tumors    no urethral polyps    no trabeculation  - Ureteral orifices were normal in position and appearance.  Retroflexion shows heaped up intravesical material with bladder neck with overlying calcification concerning for extruded urethral bulking agent  Post-Procedure: - Patient tolerated the procedure well  Assessment/ Plan:  1. Microscopic hematuria See below - Urinalysis, Complete  2. 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 Sherron Monday for evaluation of this issue  Vanna Scotland, MD

## 2023-02-08 ENCOUNTER — Ambulatory Visit (INDEPENDENT_AMBULATORY_CARE_PROVIDER_SITE_OTHER): Payer: Medicare HMO | Admitting: Urology

## 2023-02-08 DIAGNOSIS — R3129 Other microscopic hematuria: Secondary | ICD-10-CM | POA: Diagnosis not present

## 2023-02-08 NOTE — Addendum Note (Signed)
Addended by: Sueanne Margarita on: 02/08/2023 04:12 PM   Modules accepted: Orders

## 2023-02-08 NOTE — Progress Notes (Signed)
02/08/2023 3:42 PM   Erica Donaldson 02-20-1941 [id number]  Referring provider: Marisue Ivan, MD [phone] [facility] [address] [facility] [place],  [place] [address]  Chief Complaint  Patient presents with   Follow-up    HPI: Reviewed last note in detail.  She has mixed incontinence with stress incontinence.  Bulking agent worked well but was only for a few weeks.  Still has stress incontinence.  She waits too long she has urge incontinence.  She was given the new beta 3 agonist   Patient said she was 90% better but may have had a little bit of bloating or sore throat.  Headache for 2 days went away.  She wants to go back on it.  This supports an overactive bladder component.  She wants to take it until she starts feeling benefit which happened immediately and then go to every second day.  6 weeks of samples given.  We will reassess her symptoms in about 6 or 7 weeks.     I think patient still a candidate for a sling but it does support the overactive bladder component.  Other OAB options could also be considered especially if she is trying to avoid surgery.   Patient is almost completely dry on the new beta 3 agonist.  She takes it for 3 days and not for 4 days.  Frequency stable.  No infections.  Prescription given was more samples.  Dramatic improvement on new beta 3 agonist of mixed incontinence.  I will help out with the $100 a month co-pay but given her samples every 6 months.  Reassess in 6 months   Today Patient dramatically better with reduced urge incontinence on Gemtesa.  Some weeks she does not take it.  Sometimes she takes it 3 times a week.  She has been using some samples.  Clinically not infected.  Frequency stable    Today I have not seen the patient for more than a year.  She recently was worked up by my partner with a negative CT scan and cystoscopy for possible infection and blood in the urine.  Patient underwent cystoscopy and with retroflexion there was  concern for extruded bulking agent with some calcification.  Urine culture September 2024 was normal  When I initially evaluated the patient in 2022 she had mixed incontinence and primarily stress incontinence.  She was leaking with coughing sneezing bending lifting and walking.  If she held it too long she had urge incontinence.  She was soaking 3 pads a day.  She only benefit from Botox for 6 weeks.  She had a hypersensitive stable bladder with a capacity 300 mL and she had moderate stress incontinence on urodynamics.  She chose a bulking agent over a sling.  It was performed July 2021 in [place] and she was almost completely dry.  She had trouble initially to urinate after the procedure.  We were going to consider a sling but when she came back on the Gemtesa she was 90% better.  She continued to be almost dry in long-term follow-up.  When I look at the CT scan I wonder if she does have a shadow near the neck of the bladder which could be a vague calcification  The patient says she is doing beautifully on [place].  Once again she ports she does not need it every day and when she is at home is pad free.  She will take it for 3 days and then stop it.  She says 6  months ago she was having some vaginal burning worse on the left side that got better when she takes estrogen cream.  She is reasonably compliant and goes on and off it.  She says she really was not having dysuria and clinically is not infected today  Unable to leave urine sample   PMH: Past Medical History:  Diagnosis Date   Dysrhythmia    Overactive bladder    Thyroid disease     Surgical History: Past Surgical History:  Procedure Laterality Date   ABDOMINAL HYSTERECTOMY     APPENDECTOMY     BREAST EXCISIONAL BIOPSY Right    1960's 2 areas excised    Home Medications:  Allergies as of 02/08/2023       Reactions   Sulfa Antibiotics Rash        Medication List        Accurate as of February 08, 2023  3:42 PM. If  you have any questions, ask your nurse or doctor.          acetaminophen 500 MG tablet Commonly known as: TYLENOL Take by mouth.   atorvastatin 10 MG tablet Commonly known as: LIPITOR   estradiol 0.1 MG/GM vaginal cream Commonly known as: ESTRACE Apply one pea-sized amount around the opening of the urethra daily for 2 weeks, then 3 times weekly moving forward.   Hormone Cream Base Crea Apply Topically once daily prn   levothyroxine 25 MCG tablet Commonly known as: SYNTHROID Take by mouth.   metoprolol succinate 50 MG 24 hr tablet Commonly known as: TOPROL-XL Take 1 tablet by mouth 2 (two) times daily.   omeprazole 20 MG capsule Commonly known as: PRILOSEC TAKE 1 CAPSULE DAILY   Vibegron 75 MG Tabs Take 75 mg by mouth daily. What changed:  when to take this additional instructions        Allergies:  Allergies  Allergen Reactions   Sulfa Antibiotics Rash    Family History: Family History  Problem Relation Age of Onset   Cancer Maternal Aunt    Breast cancer Maternal Aunt    Breast cancer Paternal Aunt    Cancer Paternal Grandmother    Uterine cancer Paternal Grandmother    Cancer Brother    Lung cancer Brother     Social History:  reports that she has never smoked. She has never used smokeless tobacco. She reports that she does not drink alcohol and does not use drugs.  ROS:                                        Physical Exam: There were no vitals taken for this visit.  Constitutional:  Alert and oriented, No acute distress. HEENT:  AT, moist mucus membranes.  Trachea midline, no masses.   Laboratory Data: No results found for: "WBC", "HGB", "HCT", "MCV", "PLT"  No results found for: "CREATININE"  No results found for: "PSA"  No results found for: "TESTOSTERONE"  No results found for: "HGBA1C"  Urinalysis    Component Value Date/Time   COLORURINE YELLOW (A) 05/22/2017 0956   APPEARANCEUR Hazy (A) 12/16/2022  1412   LABSPEC 1.019 05/22/2017 0956   PHURINE 6.0 05/22/2017 0956   GLUCOSEU Negative 12/16/2022 1412   HGBUR NEGATIVE 05/22/2017 0956   BILIRUBINUR Negative 12/16/2022 1412   KETONESUR NEGATIVE 05/22/2017 0956   PROTEINUR Negative 12/16/2022 1412   PROTEINUR NEGATIVE 05/22/2017 0956   NITRITE  Negative 12/16/2022 1412   NITRITE NEGATIVE 05/22/2017 0956   LEUKOCYTESUR Negative 12/16/2022 1412    Pertinent Imaging:   Assessment & Plan: Picture drawn.  I would like to continue to manage the patient as she is on Gemtesa and estrogen cream.  For safety reasons I am going to bring her back in a couple of months and repeat cystoscopy and follow her long-term.  She understands she could have some local erosion of bulking agent which is discussed in the pretreatment consent.  This will likely be followed conservatively.  I did not put her on urinary prophylaxis since I did not have prove that she is getting recurrent bacterial cystitis  1. Microscopic hematuria  - Urinalysis, Complete   No follow-ups on file.  Martina Sinner, MD  [facility] [address], [address] [place], [place] [address] [phone]

## 2023-04-12 ENCOUNTER — Ambulatory Visit (INDEPENDENT_AMBULATORY_CARE_PROVIDER_SITE_OTHER): Payer: Medicare HMO | Admitting: Urology

## 2023-04-12 VITALS — BP 145/80 | HR 87

## 2023-04-12 DIAGNOSIS — N958 Other specified menopausal and perimenopausal disorders: Secondary | ICD-10-CM | POA: Diagnosis not present

## 2023-04-12 DIAGNOSIS — R350 Frequency of micturition: Secondary | ICD-10-CM

## 2023-04-12 DIAGNOSIS — R3129 Other microscopic hematuria: Secondary | ICD-10-CM | POA: Diagnosis not present

## 2023-04-12 MED ORDER — ESTRADIOL 0.1 MG/GM VA CREA: TOPICAL_CREAM | VAGINAL | 12 refills | Status: DC

## 2023-04-12 NOTE — Progress Notes (Signed)
04/12/2023 2:04 PM   Erica Donaldson 02-Apr-1941 [id number]  Referring provider: Marisue Ivan, MD [phone] [facility] [address] [facility] [place],  [place] [address]  Chief Complaint  Patient presents with   Cysto    HPI: I reviewed my lengthy note.  Urge incontinence excellent control and only takes Gemtesa sometimes only once a month or as needed.  Clinically not infected and no issues with potential erosion of bulking agent.  Using estrogen cream 3 times a week and having little to no vaginal burning.  On pelvic examination vaginal epithelium looked healthy.  No tenderness.  No prolapse or stress incontinence.  No urethral issues  Cystoscopy: Patient underwent flexible cystoscopy.  Bladder mucosa and trigone normal.  I could see at 4:00 1 area where the urothelium had broken down and I could see a little bit of the bulking agent.  No stone that was obvious.  It was very benign looking and minimal.  Urethra itself looked nice and healthy.   PMH: Past Medical History:  Diagnosis Date   Dysrhythmia    Overactive bladder    Thyroid disease     Surgical History: Past Surgical History:  Procedure Laterality Date   ABDOMINAL HYSTERECTOMY     APPENDECTOMY     BREAST EXCISIONAL BIOPSY Right    1960's 2 areas excised    Home Medications:  Allergies as of 04/12/2023       Reactions   Sulfa Antibiotics Rash        Medication List        Accurate as of April 12, 2023  2:04 PM. If you have any questions, ask your nurse or doctor.          acetaminophen 500 MG tablet Commonly known as: TYLENOL Take by mouth.   atorvastatin 10 MG tablet Commonly known as: LIPITOR   estradiol 0.1 MG/GM vaginal cream Commonly known as: ESTRACE Apply one pea-sized amount around the opening of the urethra daily for 2 weeks, then 3 times weekly moving forward.   Hormone Cream Base Crea Apply Topically once daily prn   levothyroxine 25 MCG tablet Commonly known as:  SYNTHROID Take by mouth.   metoprolol succinate 50 MG 24 hr tablet Commonly known as: TOPROL-XL Take 1 tablet by mouth 2 (two) times daily.   omeprazole 20 MG capsule Commonly known as: PRILOSEC TAKE 1 CAPSULE DAILY   Vibegron 75 MG Tabs Take 75 mg by mouth daily. What changed:  when to take this additional instructions        Allergies:  Allergies  Allergen Reactions   Sulfa Antibiotics Rash    Family History: Family History  Problem Relation Age of Onset   Cancer Maternal Aunt    Breast cancer Maternal Aunt    Breast cancer Paternal Aunt    Cancer Paternal Grandmother    Uterine cancer Paternal Grandmother    Cancer Brother    Lung cancer Brother     Social History:  reports that she has never smoked. She has never used smokeless tobacco. She reports that she does not drink alcohol and does not use drugs.  ROS:                                        Physical Exam: BP (!) 145/80   Pulse 87   Constitutional:  Alert and oriented, No acute distress. HEENT: [place] AT, moist  mucus membranes.  Trachea midline, no masses.  Laboratory Data: No results found for: "WBC", "HGB", "HCT", "MCV", "PLT"  No results found for: "CREATININE"  No results found for: "PSA"  No results found for: "TESTOSTERONE"  No results found for: "HGBA1C"  Urinalysis    Component Value Date/Time   COLORURINE YELLOW (A) 05/22/2017 0956   APPEARANCEUR Hazy (A) 12/16/2022 1412   LABSPEC 1.019 05/22/2017 0956   PHURINE 6.0 05/22/2017 0956   GLUCOSEU Negative 12/16/2022 1412   HGBUR NEGATIVE 05/22/2017 0956   BILIRUBINUR Negative 12/16/2022 1412   KETONESUR NEGATIVE 05/22/2017 0956   PROTEINUR Negative 12/16/2022 1412   PROTEINUR NEGATIVE 05/22/2017 0956   NITRITE Negative 12/16/2022 1412   NITRITE NEGATIVE 05/22/2017 0956   LEUKOCYTESUR Negative 12/16/2022 1412    Pertinent Imaging: Urine reviewed and sent for culture  Assessment & Plan: Continue to take  Gemtesa as needed.  Continues estrogen cream 2-3 times a week.  Watchful waiting for any bulking agent erosion.  See in 1 year estrogen renewed.  Picture drawn.  Risk of forming a stone that was clinically relevant discussed.  Call if culture positive  There are no diagnoses linked to this encounter.  No follow-ups on file.  Martina Sinner, MD  [facility] [address] [place], [place] [address] [phone]

## 2023-04-13 LAB — URINALYSIS, COMPLETE
Bilirubin, UA: NEGATIVE
Glucose, UA: NEGATIVE
Ketones, UA: NEGATIVE
Leukocytes,UA: NEGATIVE
Nitrite, UA: NEGATIVE
Protein,UA: NEGATIVE
Specific Gravity, UA: 1.015 (ref 1.005–1.030)
Urobilinogen, Ur: 0.2 mg/dL (ref 0.2–1.0)
pH, UA: 7 (ref 5.0–7.5)

## 2023-04-13 LAB — MICROSCOPIC EXAMINATION

## 2023-04-15 LAB — CULTURE, URINE COMPREHENSIVE

## 2023-06-04 ENCOUNTER — Other Ambulatory Visit: Payer: Self-pay | Admitting: Obstetrics and Gynecology

## 2023-06-04 DIAGNOSIS — Z1231 Encounter for screening mammogram for malignant neoplasm of breast: Secondary | ICD-10-CM

## 2023-06-29 ENCOUNTER — Ambulatory Visit
Admission: RE | Admit: 2023-06-29 | Discharge: 2023-06-29 | Disposition: A | Discharge status: Home / Self Care | Payer: Medicare HMO | Source: Ambulatory Visit | Attending: Obstetrics and Gynecology | Admitting: Obstetrics and Gynecology

## 2023-06-29 DIAGNOSIS — Z1231 Encounter for screening mammogram for malignant neoplasm of breast: Secondary | ICD-10-CM | POA: Diagnosis present

## 2023-07-02 ENCOUNTER — Other Ambulatory Visit: Payer: Self-pay | Admitting: Obstetrics and Gynecology

## 2023-07-02 DIAGNOSIS — R928 Other abnormal and inconclusive findings on diagnostic imaging of breast: Secondary | ICD-10-CM

## 2023-07-08 ENCOUNTER — Ambulatory Visit
Admission: RE | Admit: 2023-07-08 | Discharge: 2023-07-08 | Disposition: A | Discharge status: Home / Self Care | Source: Ambulatory Visit | Attending: Obstetrics and Gynecology | Admitting: Obstetrics and Gynecology

## 2023-07-08 DIAGNOSIS — R928 Other abnormal and inconclusive findings on diagnostic imaging of breast: Secondary | ICD-10-CM | POA: Insufficient documentation

## 2023-07-09 IMAGING — MG MM DIGITAL SCREENING BILAT W/ TOMO AND CAD
8 series · 9 of 24 positions shown · non-contrast
Comparison: Previous exam(s).

CLINICAL DATA: Screening.

EXAM:
DIGITAL SCREENING BILATERAL MAMMOGRAM WITH TOMOSYNTHESIS AND CAD
TECHNIQUE: Bilateral screening digital craniocaudal and mediolateral oblique
mammograms were obtained. Bilateral screening digital breast
tomosynthesis was performed. The images were evaluated with
computer-aided detection.

[R MLO synth-2D]
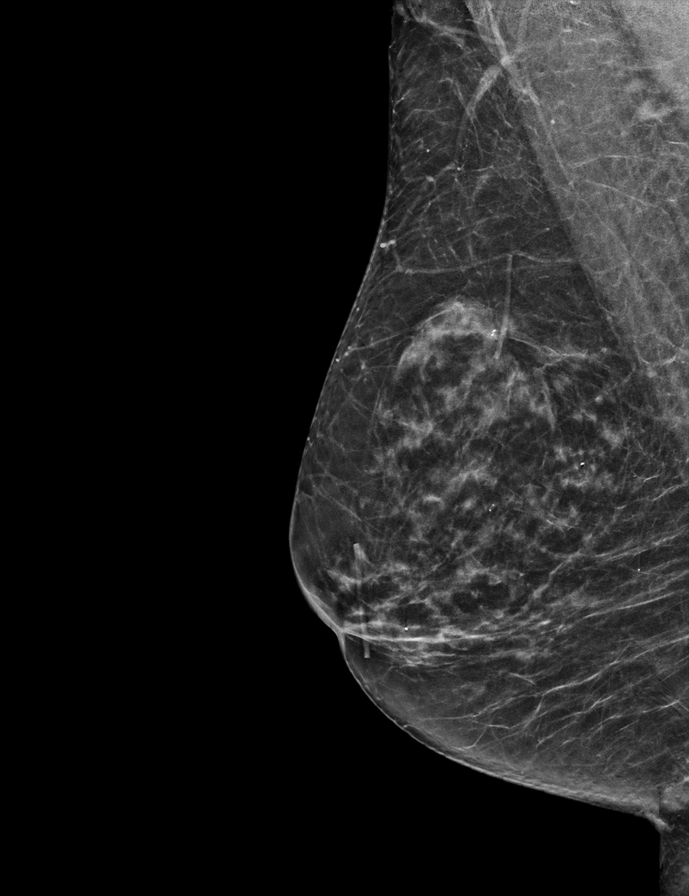

[L MLO synth-2D]
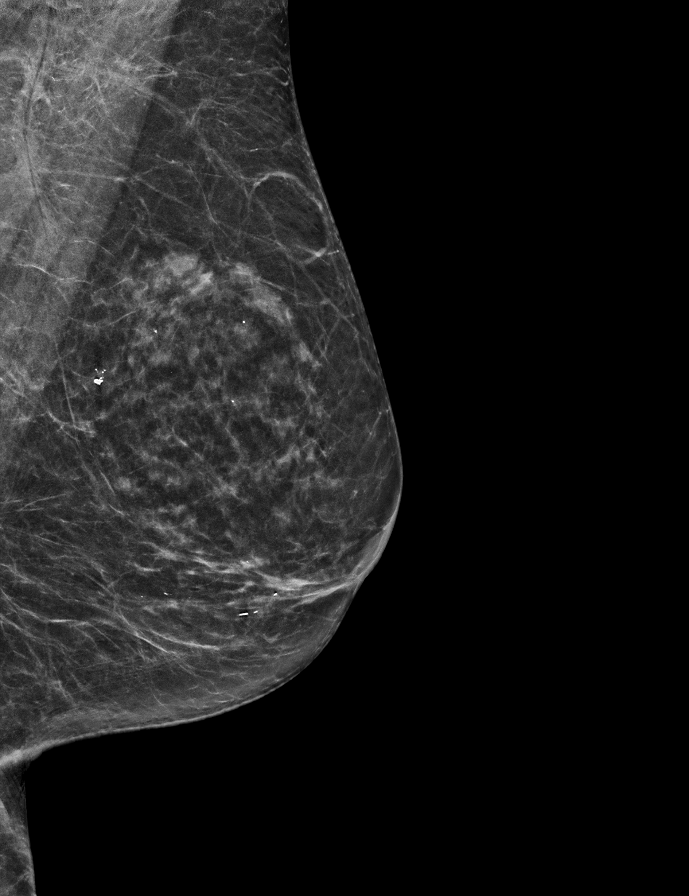

[L CC synth-2D]
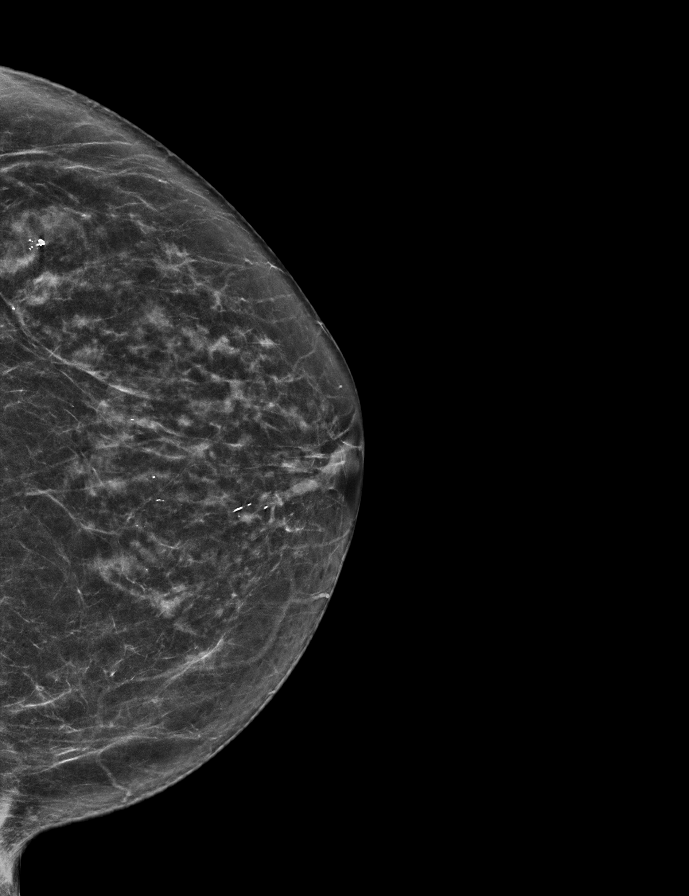

[R CC synth-2D]
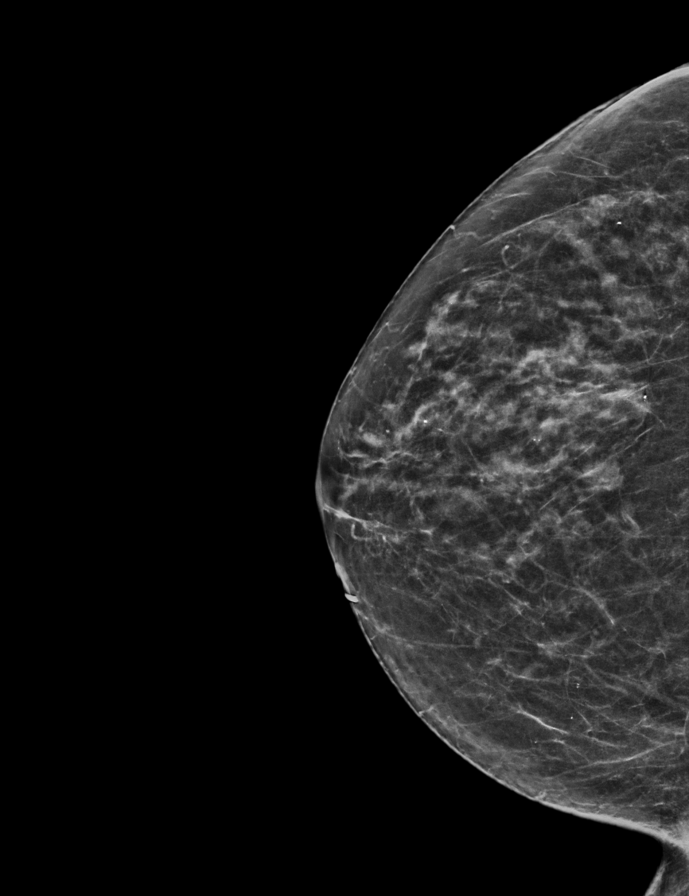

[R MLO tomo · 2 of 57 frames shown]
[frame 19/57]
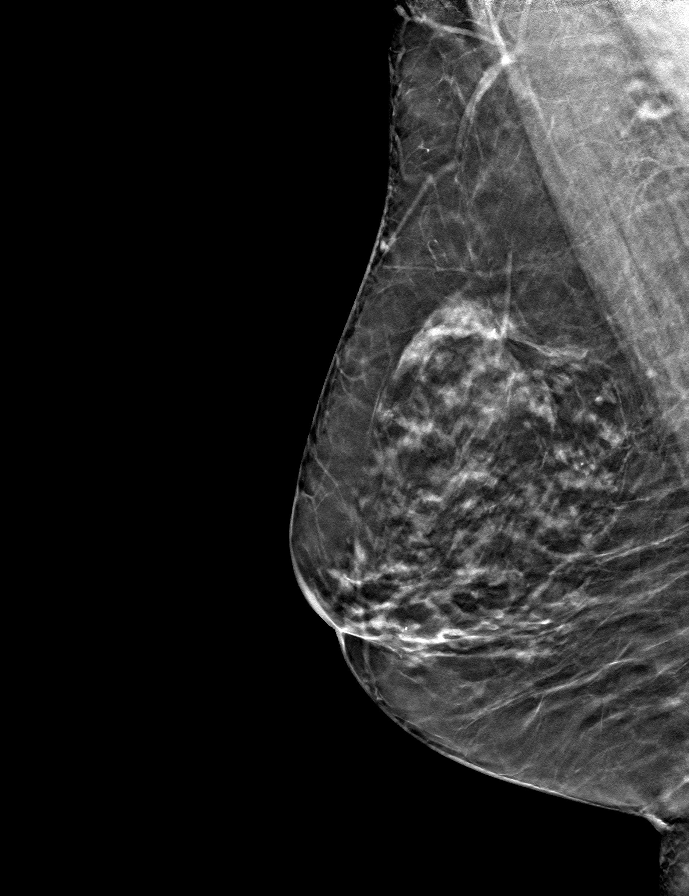
[frame 29/57]
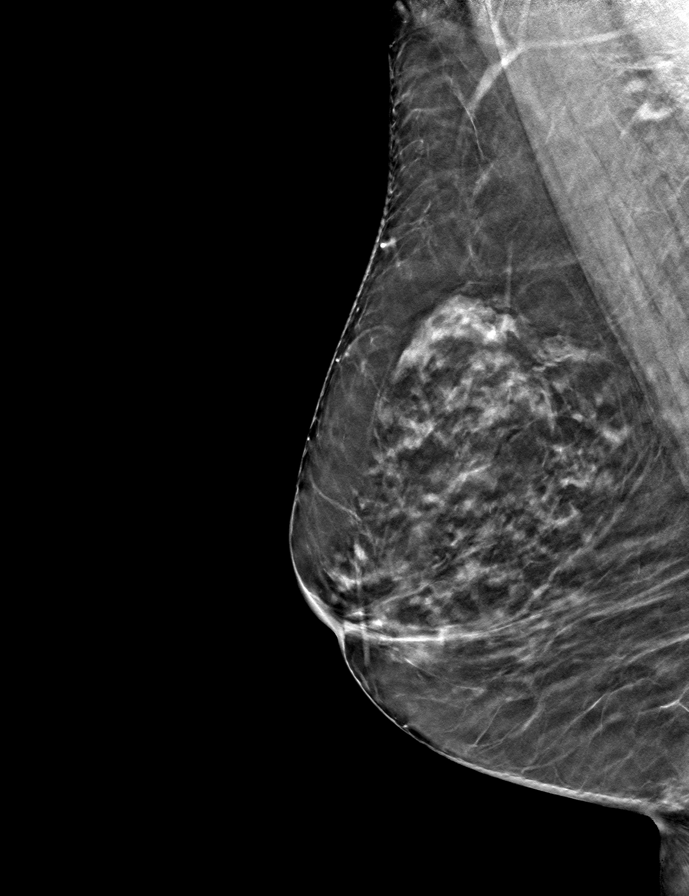

[L MLO tomo · tomo slice 29/56.0]
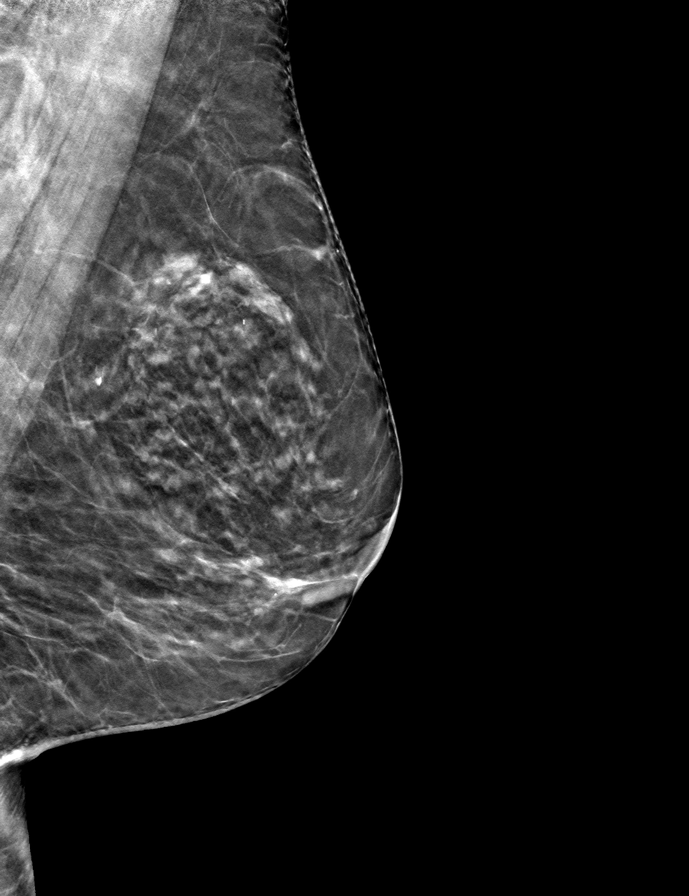

[L CC tomo · tomo slice 30/59.0]
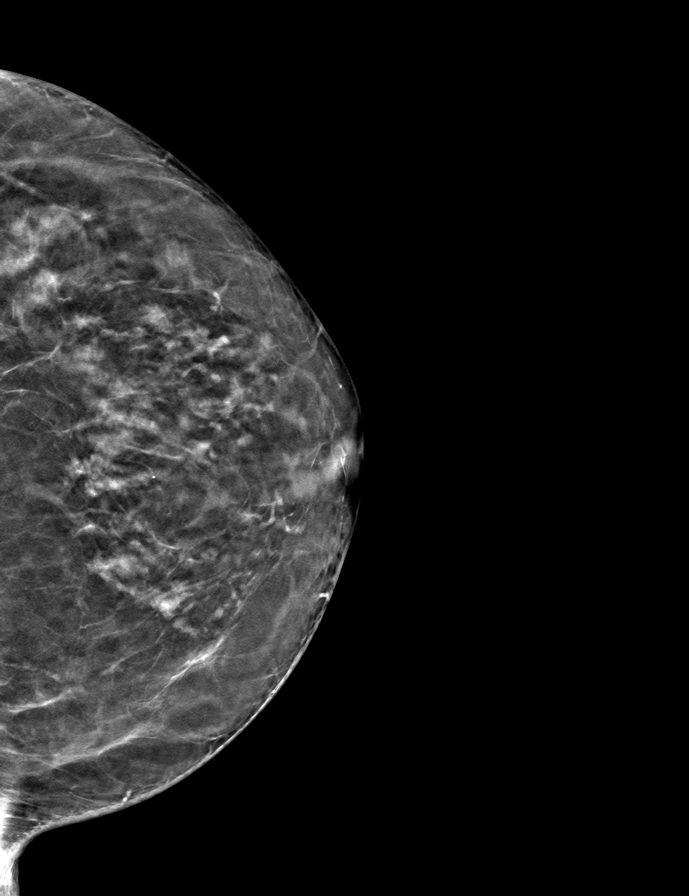

[R CC tomo · tomo slice 27/54.0]
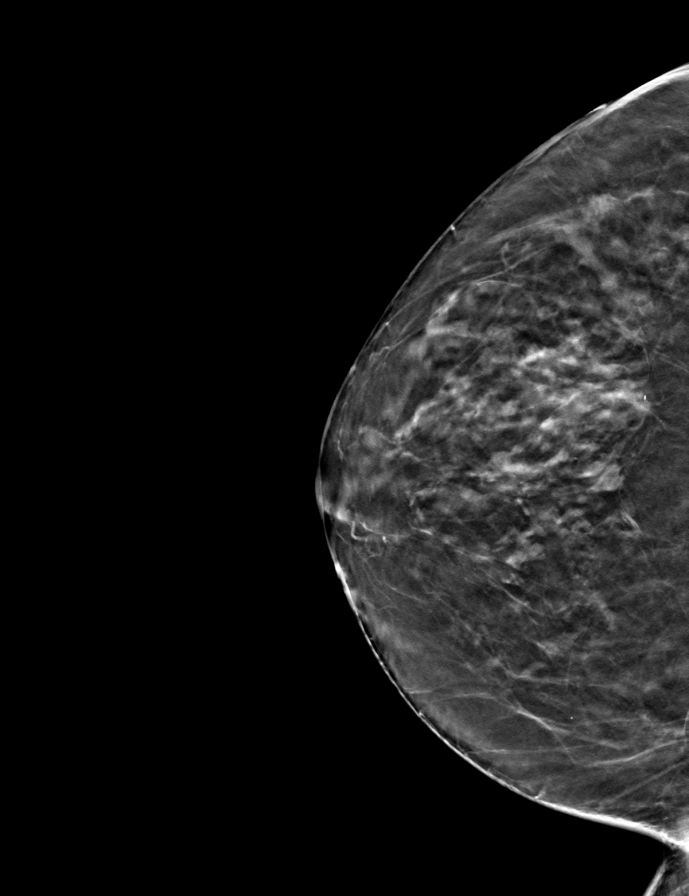

[9 of 24 positions shown; findings below may reference images not displayed]

ACR Breast Density Category b: There are scattered areas of
fibroglandular density.
FINDINGS: There are no findings suspicious for malignancy.
IMPRESSION: No mammographic evidence of malignancy. A result letter of this
screening mammogram will be mailed directly to the patient.

RECOMMENDATION:
Screening mammogram in one year. (Code:51-O-LD2)

BI-RADS CATEGORY  1: Negative.

## 2023-07-14 ENCOUNTER — Other Ambulatory Visit: Payer: Self-pay | Admitting: Obstetrics and Gynecology

## 2023-07-14 DIAGNOSIS — R928 Other abnormal and inconclusive findings on diagnostic imaging of breast: Secondary | ICD-10-CM

## 2023-07-20 ENCOUNTER — Ambulatory Visit
Admission: RE | Admit: 2023-07-20 | Discharge: 2023-07-20 | Disposition: A | Discharge status: Home / Self Care | Source: Ambulatory Visit | Attending: Obstetrics and Gynecology | Admitting: Obstetrics and Gynecology

## 2023-07-20 DIAGNOSIS — R928 Other abnormal and inconclusive findings on diagnostic imaging of breast: Secondary | ICD-10-CM

## 2023-07-20 DIAGNOSIS — C50412 Malignant neoplasm of upper-outer quadrant of left female breast: Secondary | ICD-10-CM | POA: Insufficient documentation

## 2023-07-20 HISTORY — PX: BREAST BIOPSY: SHX20

## 2023-07-20 MED ORDER — LIDOCAINE 1 % OPTIME INJ - NO CHARGE
2.0000 mL | Freq: Once | INTRAMUSCULAR | Status: AC
  Administered 2023-07-20: 2 mL
  Filled 2023-07-20: qty 2

## 2023-07-20 MED ORDER — LIDOCAINE-EPINEPHRINE 1 %-1:100000 IJ SOLN
8.0000 mL | Freq: Once | INTRAMUSCULAR | Status: AC
  Administered 2023-07-20: 8 mL
  Filled 2023-07-20: qty 8

## 2023-07-21 ENCOUNTER — Encounter: Payer: Self-pay | Admitting: *Deleted

## 2023-07-21 DIAGNOSIS — C50919 Malignant neoplasm of unspecified site of unspecified female breast: Secondary | ICD-10-CM

## 2023-07-21 LAB — SURGICAL PATHOLOGY

## 2023-07-21 NOTE — Progress Notes (Signed)
 Received referral for newly diagnosed breast cancer from [facility].  Navigation initiated.  She would like to see Dr. Dwain Sarna and Dr. Smith Robert.   She is seeing Dr. Smith Robert on 4/15 at 2:30.  Referral has been sent to [facility] and they will call her with the appt. For Dr. Dwain Sarna.

## 2023-07-27 ENCOUNTER — Inpatient Hospital Stay

## 2023-07-27 ENCOUNTER — Encounter: Payer: Self-pay | Admitting: Oncology

## 2023-07-27 ENCOUNTER — Inpatient Hospital Stay: Attending: Oncology | Admitting: Oncology

## 2023-07-27 ENCOUNTER — Encounter: Payer: Self-pay | Admitting: *Deleted

## 2023-07-27 VITALS — BP 116/54 | HR 76 | Temp 96.6°F | Resp 18 | Ht 63.0 in | Wt 133.0 lb

## 2023-07-27 DIAGNOSIS — Z8049 Family history of malignant neoplasm of other genital organs: Secondary | ICD-10-CM | POA: Insufficient documentation

## 2023-07-27 DIAGNOSIS — Z90710 Acquired absence of both cervix and uterus: Secondary | ICD-10-CM | POA: Insufficient documentation

## 2023-07-27 DIAGNOSIS — Z1721 Progesterone receptor positive status: Secondary | ICD-10-CM | POA: Insufficient documentation

## 2023-07-27 DIAGNOSIS — Z1732 Human epidermal growth factor receptor 2 negative status: Secondary | ICD-10-CM | POA: Diagnosis not present

## 2023-07-27 DIAGNOSIS — Z7189 Other specified counseling: Secondary | ICD-10-CM

## 2023-07-27 DIAGNOSIS — C50412 Malignant neoplasm of upper-outer quadrant of left female breast: Secondary | ICD-10-CM | POA: Diagnosis not present

## 2023-07-27 DIAGNOSIS — Z803 Family history of malignant neoplasm of breast: Secondary | ICD-10-CM | POA: Insufficient documentation

## 2023-07-27 DIAGNOSIS — Z801 Family history of malignant neoplasm of trachea, bronchus and lung: Secondary | ICD-10-CM | POA: Diagnosis not present

## 2023-07-27 DIAGNOSIS — Z170 Estrogen receptor positive status [ER+]: Secondary | ICD-10-CM | POA: Diagnosis not present

## 2023-07-27 NOTE — Progress Notes (Signed)
 Accompanied patient and family to initial medical oncology appointment.   Reviewed Breast Cancer treatment handbook.   Care plan summary given to patient.   Reviewed outreach programs and cancer center services.

## 2023-07-27 NOTE — Progress Notes (Unsigned)
 Hematology/Oncology Consult note [facility] Telephone:([phone] [phone]  Patient Care Team: Marisue Ivan, MD as PCP - General (Family Medicine) Hulen Luster, RN as Oncology Nurse Navigator   Name of the patient: Erica Donaldson  [id number]  1941-03-28    Reason for referral-new diagnosis of breast cancer   Referring physician-Dr. Burnadette Pop  Date of visit: 07/27/23   History of presenting illness-patient is a 83 year old female with a past medical history Significant for hypothyroidism and overactive bladder.  She underwent a screening mammogram in March 2025 which showed a possible mass in the left breast.  This was followed by diagnostic mammogram and an ultrasound which showed irregular hypoechoic mass at the 1 o'clock position 2 cm from the nipple measuring 5 x 4 x 2 mm.  No left axillary adenopathy.  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  Patient will remain pending Dr. Dwain Sarna later this morning to discuss surgery.  Menarche at 24.  She has 1 son and age of first birth 79.  She used birth control pills for about 5 years after her son was born.  She has had hysterectomy and bilateral oophorectomy.  She has been using estradiol cream both vaginally and over the skin of her forearm.  She is on testosterone  supplements as well.  ECOG PS- 1  Pain scale- 0   Review of systems- Review of Systems  Constitutional:  Negative for chills, fever, malaise/fatigue and weight loss.  HENT:  Negative for congestion, ear discharge and nosebleeds.   Eyes:  Negative for blurred vision.  Respiratory:  Negative for cough, hemoptysis, sputum production, shortness of breath and wheezing.   Cardiovascular:  Negative for chest pain, palpitations, orthopnea and claudication.  Gastrointestinal:   Negative for abdominal pain, blood in stool, constipation, diarrhea, heartburn, melena, nausea and vomiting.  Genitourinary:  Negative for dysuria, flank pain, frequency, hematuria and urgency.  Musculoskeletal:  Negative for back pain, joint pain and myalgias.  Skin:  Negative for rash.  Neurological:  Negative for dizziness, tingling, focal weakness, seizures, weakness and headaches.  Endo/Heme/Allergies:  Does not bruise/bleed easily.  Psychiatric/Behavioral:  Negative for depression and suicidal ideas. The patient does not have insomnia.     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   History of hysterectomy 05/03/2018   Urinary, incontinence, stress female 05/03/2018   Vaginal atrophy 05/03/2018   Dyspareunia due to medical condition in female 05/03/2018     Past Medical History:  Diagnosis Date   Dysrhythmia    Overactive bladder    Thyroid disease      Past Surgical History:  Procedure Laterality Date   ABDOMINAL HYSTERECTOMY     APPENDECTOMY     BREAST BIOPSY Left 07/20/2023   [place] LT BREAST BX W LOC DEV 1ST LESION IMG BX SPEC US GUIDE 07/20/2023 [facility]   BREAST EXCISIONAL BIOPSY Right    1960's 2 areas excised    Social History   Socioeconomic History   Marital status: Married    Spouse name: Not on file   Number of children: Not on file   Years of education: Not on file   Highest education level: Not on file  Occupational History   Not on file  Tobacco Use   Smoking status: Never   Smokeless tobacco: Never  Vaping Use   Vaping status: Never Used  Substance and Sexual Activity   Alcohol use: No   Drug use: No   Sexual activity: Not on file  Other Topics Concern   Not on file  Social History Narrative   Not on file   Social Drivers of Health   Financial Resource Strain: Low Risk  (03/30/2023)   Received from [facility]   Overall Financial Resource Strain (CARDIA)    Difficulty of Paying Living Expenses: Not hard at all  Food Insecurity: No Food Insecurity (03/30/2023)   Received from [facility]   Hunger Vital Sign    Worried About Running Out of Food in the Last Year: Never true    Ran Out of Food in the Last Year: Never true  Transportation Needs: No Transportation Needs (03/30/2023)   Received from [facility] - Transportation    In the past 12 months, has lack of transportation kept you from medical appointments or from getting medications?: No    Lack of Transportation (Non-Medical): No  Physical Activity: Not on file  Stress: Not on file  Social Connections: Not on file  Intimate Partner Violence: Not on file     Family History  Problem Relation Age of Onset   Cancer Maternal Aunt    Breast cancer Maternal Aunt    Breast cancer Paternal Aunt    Cancer Paternal Grandmother    Uterine cancer Paternal Grandmother    Cancer Brother    Lung cancer Brother      Current Outpatient Medications:    acetaminophen (TYLENOL) 500 MG tablet, Take by mouth., Disp: , Rfl:    atorvastatin (LIPITOR) 10 MG tablet, , Disp: , Rfl:    estradiol (ESTRACE) 0.1 MG/GM vaginal cream, Apply one pea-sized amount around the opening of the urethra daily for 2 weeks, then 3 times weekly moving forward., Disp: 127.5 g, Rfl: 12   Hormone Cream Base CREA, Apply Topically once daily prn, Disp: 30 g, Rfl: 11   levothyroxine (SYNTHROID) 25 MCG tablet, Take by mouth., Disp: , Rfl:    metoprolol succinate (TOPROL-XL) 50 MG 24 hr tablet, Take 1 tablet by mouth 2 (two) times daily., Disp: , Rfl:    omeprazole (PRILOSEC) 20 MG capsule, TAKE 1 CAPSULE DAILY, Disp: , Rfl:    Vibegron 75 MG TABS, Take 75 mg by mouth daily. (Patient taking differently: Take 75 mg by mouth. Takes 3 a month), Disp: 90 tablet, Rfl: 3   Physical exam:  Vitals:   07/27/23 1441  BP: (!)  116/54  Pulse: 76  Resp: 18  Temp: (!) 96.6 F (35.9 C)  TempSrc: Tympanic  SpO2: 99%  Weight: 133 lb (60.3 kg)  Height: 5\' 3"  (1.6 m)   Physical Exam Cardiovascular:     Rate and Rhythm: Normal rate and regular rhythm.     Heart sounds: Normal heart sounds.  Pulmonary:     Effort: Pulmonary effort is normal.     Breath sounds: Normal breath sounds.  Skin:    General: Skin is warm and dry.  Neurological:     Mental Status: She is alert and oriented to person, place, and time.            No data to display             No data to display  No images are attached to the encounter.  [place] LT BREAST BX W LOC DEV 1ST LESION IMG BX SPEC US GUIDE Addendum Date: 07/21/2023 ADDENDUM REPORT: 07/21/2023 15:00 ADDENDUM: PATHOLOGY revealed: 1. Breast, left, needle core biopsy, 1:00 2 cmfn (ribbon clip) : - INVASIVE MAMMARY CARCINOMA, NO SPECIAL TYPE. - OVERALL GRADE: 2 - LYMPHOVASCULAR INVASION: NOT IDENTIFIED - CANCER LENGTH: 5 MM - CALCIFICATIONS: NOT IDENTIFIED - DUCTAL CARCINOMA IN SITU: PRESENT, INTERMEDIATE GRADE. Pathology results are CONCORDANT with imaging findings, per Dr. Jacob Moores. Pathology results and recommendations were discussed with patient via telephone on 07/21/2023 by Randa Lynn RN. Patient reported biopsy site doing well with no adverse symptoms, and only slight tenderness at the site. Post biopsy care instructions were reviewed, questions were answered and my direct phone number was provided. Patient was instructed to call [facility] for any additional questions or concerns related to biopsy site. RECOMMENDATIONS: 1. Surgical and oncological consultation. Request for surgical and oncological consultation relayed to Irving Shows RN at [facility] by Randa Lynn RN on 07/21/2023. Pathology results reported by Randa Lynn RN on 07/21/2023. Electronically Signed   By: Jacob Moores M.D.   On: 07/21/2023 15:00   Result Date:  07/21/2023 CLINICAL DATA:  83 year old female presenting for ultrasound-guided biopsy of a suspicious screen detected mass in the LEFT breast. EXAM: ULTRASOUND GUIDED LEFT BREAST CORE NEEDLE BIOPSY COMPARISON:  Previous exam(s). PROCEDURE: I met with the patient and we discussed the procedure of ultrasound-guided biopsy, including benefits and alternatives. We discussed the high likelihood of a successful procedure. We discussed the risks of the procedure, including infection, bleeding, tissue injury, clip migration, and inadequate sampling. Informed written consent was given. The usual time-out protocol was performed immediately prior to the procedure. Lesion quadrant: Upper outer quadrant Using sterile technique and 1% Lidocaine as local anesthetic, under direct ultrasound visualization, a 14 gauge spring-loaded device was used to perform biopsy of a mass in the left breast 1 o'clock position 2 cm from nipple using a lateral approach. At the conclusion of the procedure, a ribbon shaped tissue marker clip was deployed into the biopsy cavity. Follow up 2 view mammogram was performed and dictated separately. IMPRESSION: Ultrasound guided biopsy of a mass in the LEFT breast 1 o'clock position (ribbon clip). No apparent complications. Electronically Signed: By: Jacob Moores M.D. On: 07/20/2023 14:21   MM CLIP PLACEMENT LEFT Result Date: 07/20/2023 CLINICAL DATA:  Status post ultrasound-guided biopsy of a suspicious mass in the left breast EXAM: 3D DIAGNOSTIC LEFT MAMMOGRAM POST ULTRASOUND BIOPSY COMPARISON:  Previous exam(s). ACR Breast Density Category b: There are scattered areas of fibroglandular density. FINDINGS: 3D Mammographic images were obtained following ultrasound guided biopsy of a suspicious mass in the left breast 1 o'clock position 2 cm from the nipple. The ribbon shaped biopsy marking clip is in expected position at the site of biopsy. Of note, the position of the ribbon shaped biopsy marking clip  confirms mammographic/sonographic correlation. IMPRESSION: Appropriate positioning of the ribbon shaped biopsy marking clip at the site of biopsy in the LEFT breast 1 o'clock position. Final Assessment: Post Procedure Mammograms for Marker Placement Electronically Signed   By: Jacob Moores M.D.   On: 07/20/2023 14:30   MM 3D DIAGNOSTIC MAMMOGRAM UNILATERAL LEFT BREAST Result Date: 07/08/2023 CLINICAL DATA:  Screening recall for possible LEFT breast asymmetry EXAM: DIGITAL DIAGNOSTIC UNILATERAL LEFT MAMMOGRAM WITH TOMOSYNTHESIS AND CAD; ULTRASOUND LEFT BREAST LIMITED TECHNIQUE: Left digital diagnostic mammography and breast tomosynthesis was performed.  The images were evaluated with computer-aided detection. ; Targeted ultrasound examination of the left breast was performed. COMPARISON:  Previous exam(s). ACR Breast Density Category b: There are scattered areas of fibroglandular density. FINDINGS: Mammogram: Diagnostic views demonstrate a persistent 5 mm focal asymmetry with associated subtle architectural distortion in the upper slightly outer left breast middle depth (medially rolled CC image 33/52, spot ML image 38/52). This corresponds with the screening mammogram finding. No additional suspicious findings are identified in the left breast. Ultrasound: Targeted left breast ultrasound was performed by the sonographer and the physician. At 1 o'clock 2 cm from nipple, there is a subtle irregular hypoechoic mass that measures 5 x 4 x 2 mm. There is no internal vascularity. This is favored to correspond with the focal asymmetry seen on mammogram. Targeted left axillary ultrasound demonstrates a morphologically benign lymph node. IMPRESSION: 1. LEFT breast 5 mm irregular hypoechoic mass in the 1 o'clock position is suspicious for malignancy. Recommend further assessment with ultrasound-guided biopsy. 2. No LEFT axillary lymphadenopathy. RECOMMENDATION: LEFT breast ultrasound-guided biopsy (1 site). Recommend  close attention to post-procedure mammogram to ensure mammographic/sonographic correlation. I have discussed the findings and recommendations with the patient. The biopsy procedure was discussed with the patient and questions were answered. Patient expressed their understanding of the biopsy recommendation. Patient will be scheduled for biopsy at her earliest convenience by the schedulers. Ordering provider will be notified. If applicable, a reminder letter will be sent to the patient regarding the next appointment. BI-RADS CATEGORY  4: Suspicious. Electronically Signed   By: Jacob Moores M.D.   On: 07/08/2023 11:30   [place] LIMITED ULTRASOUND INCLUDING AXILLA LEFT BREAST  Result Date: 07/08/2023 CLINICAL DATA:  Screening recall for possible LEFT breast asymmetry EXAM: DIGITAL DIAGNOSTIC UNILATERAL LEFT MAMMOGRAM WITH TOMOSYNTHESIS AND CAD; ULTRASOUND LEFT BREAST LIMITED TECHNIQUE: Left digital diagnostic mammography and breast tomosynthesis was performed. The images were evaluated with computer-aided detection. ; Targeted ultrasound examination of the left breast was performed. COMPARISON:  Previous exam(s). ACR Breast Density Category b: There are scattered areas of fibroglandular density. FINDINGS: Mammogram: Diagnostic views demonstrate a persistent 5 mm focal asymmetry with associated subtle architectural distortion in the upper slightly outer left breast middle depth (medially rolled CC image 33/52, spot ML image 38/52). This corresponds with the screening mammogram finding. No additional suspicious findings are identified in the left breast. Ultrasound: Targeted left breast ultrasound was performed by the sonographer and the physician. At 1 o'clock 2 cm from nipple, there is a subtle irregular hypoechoic mass that measures 5 x 4 x 2 mm. There is no internal vascularity. This is favored to correspond with the focal asymmetry seen on mammogram. Targeted left axillary ultrasound demonstrates a morphologically  benign lymph node. IMPRESSION: 1. LEFT breast 5 mm irregular hypoechoic mass in the 1 o'clock position is suspicious for malignancy. Recommend further assessment with ultrasound-guided biopsy. 2. No LEFT axillary lymphadenopathy. RECOMMENDATION: LEFT breast ultrasound-guided biopsy (1 site). Recommend close attention to post-procedure mammogram to ensure mammographic/sonographic correlation. I have discussed the findings and recommendations with the patient. The biopsy procedure was discussed with the patient and questions were answered. Patient expressed their understanding of the biopsy recommendation. Patient will be scheduled for biopsy at her earliest convenience by the schedulers. Ordering provider will be notified. If applicable, a reminder letter will be sent to the patient regarding the next appointment. BI-RADS CATEGORY  4: Suspicious. Electronically Signed   By: Jacob Moores M.D.   On: 07/08/2023 11:30   MM  3D SCREENING MAMMOGRAM BILATERAL BREAST Result Date: 07/01/2023 CLINICAL DATA:  Screening. EXAM: DIGITAL SCREENING BILATERAL MAMMOGRAM WITH TOMOSYNTHESIS AND CAD TECHNIQUE: Bilateral screening digital craniocaudal and mediolateral oblique mammograms were obtained. Bilateral screening digital breast tomosynthesis was performed. The images were evaluated with computer-aided detection. COMPARISON:  Previous exam(s). ACR Breast Density Category b: There are scattered areas of fibroglandular density. FINDINGS: In the left breast, a possible mass warrants further evaluation. In the right breast, no findings suspicious for malignancy. IMPRESSION: Further evaluation is suggested for a possible mass in the left breast. RECOMMENDATION: Diagnostic mammogram and possibly ultrasound of the left breast. (Code:[id number]) The patient will be contacted regarding the findings, and additional imaging will be scheduled. BI-RADS CATEGORY  0: Incomplete: Need additional imaging evaluation. Electronically Signed   By:  Harmon Pier M.D.   On: 07/01/2023 11:16    Assessment and plan- Patient is a 83 y.o. 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  I would recommend upfront lumpectomy for the same and consideration for sentinel lymph node biopsy as determined by surgery.  Typically for tumors greater than equal to 5 mm and grade 2 Oncotype testing is recommended to decide if patient would benefit from adjuvant chemotherapy.  Discussed what Oncotype testing is and how the results are interpreted.  For her age if her score is 25 or less she would not benefit from adjuvant chemotherapy.  Adjuvant chemotherapy would be indicated if score is 26 or higher.  Based on her final pathology we will decide if we need to send that versus biopsy specimen for Oncotype testing.  Patient would like to know the results before deciding about adjuvant chemotherapy.  I will refer her to radiation oncology as well to see if she would benefit from adjuvant radiation therapy at this time Given that she has ER/PR positive disease she would benefit from adjuvant endocrine therapy which I will discuss with her in greater detail down the line and ideally can be taken for 5 years.  She does have baseline osteoporosis and tamoxifen may be a better option for her as compared to aromatase inhibitor especially given that she has had cataract surgery as well as hysterectomy.  I have advised her to stop using estrogen cream topically although she may continue to use estrogen vaginal cream.  That would have a small but unpredictable systemic estrogen excess absorption.  However given her  age it may offer her a better quality of life and reduce risk of UTIs.  Testosterone supplements generally are safe with patients with breast cancer but I am concerned about using tamoxifen in the future if she were to stay on testosterone supplements given the risk of thrombosis.  Patient is agreeable to stop both her topical estrogen as well as testosterone at this time.    No indication for surveillance imaging now or in the future for stage I breast cancer.  I will also make a referral for genetic counseling.  Treatment will be given with a curative intent.   Cancer Staging  Malignant neoplasm of upper-outer quadrant of left breast in female, estrogen receptor positive (HCC) Staging form: Breast, AJCC 8th Edition -  Clinical stage from 07/27/2023: Stage IA (cT1a, cN0, cM0, G2, ER+, PR+, HER2-) - Signed by Avonne Boettcher, MD on 07/27/2023 Histologic grading system: 3 grade system     Thank you for this kind referral and the opportunity to participate in the care of this  Patient   Visit Diagnosis 1. Malignant neoplasm of upper-outer quadrant of left breast in female, estrogen receptor positive (HCC)   2. Goals of care, counseling/discussion     Dr. Seretha Dance, MD, MPH [facility] [address] [facility] [phone] 07/27/2023

## 2023-07-28 ENCOUNTER — Encounter: Payer: Self-pay | Admitting: Oncology

## 2023-08-10 ENCOUNTER — Other Ambulatory Visit: Payer: Self-pay | Admitting: General Surgery

## 2023-08-10 DIAGNOSIS — Z170 Estrogen receptor positive status [ER+]: Secondary | ICD-10-CM

## 2023-08-11 ENCOUNTER — Other Ambulatory Visit: Payer: Self-pay | Admitting: General Surgery

## 2023-08-12 ENCOUNTER — Other Ambulatory Visit: Payer: Self-pay | Admitting: General Surgery

## 2023-08-12 DIAGNOSIS — Z170 Estrogen receptor positive status [ER+]: Secondary | ICD-10-CM

## 2023-08-13 ENCOUNTER — Encounter: Payer: Self-pay | Admitting: *Deleted

## 2023-08-13 DIAGNOSIS — Z170 Estrogen receptor positive status [ER+]: Secondary | ICD-10-CM

## 2023-08-13 NOTE — Progress Notes (Signed)
 Lumpectomy is scheduled for 5/8, she will see Dr. Randy Buttery and Dr. Jacalyn Martin on 6/4.  Appt. Details given to her.

## 2023-08-16 NOTE — Pre-Procedure Instructions (Signed)
 Surgical Instructions   Your procedure is scheduled on Aug 19, 2023. Report to [facility] Main Entrance "A" at 8:45 A.M., then check in with the Admitting office. Any questions or running late day of surgery: call [phone]  Questions prior to your surgery date: call [phone], Monday-Friday, 8am-4pm. If you experience any cold or flu symptoms such as cough, fever, chills, shortness of breath, etc. between now and your scheduled surgery, please notify us  at the above number.     Remember:  Do not eat after midnight the night before your surgery   You may drink clear liquids until 7:45 AM the morning of your surgery.   Clear liquids allowed are: Water, Non-Citrus Juices (without pulp), Carbonated Beverages, Clear Tea (no milk, honey, etc.), Black Coffee Only (NO MILK, CREAM OR POWDERED CREAMER of any kind), and Gatorade.  Patient Instructions  The night before surgery:  No food after midnight. ONLY clear liquids after midnight  The day of surgery (if you do NOT have diabetes):  Drink ONE (1) Pre-Surgery Clear Ensure by 7:45 AM the morning of surgery. Drink in one sitting. Do not sip.  This drink was given to you during your hospital  pre-op appointment visit.  Nothing else to drink after completing the  Pre-Surgery Clear Ensure.         If you have questions, please contact your surgeon's office.   Take these medicines the morning of surgery with A SIP OF WATER: metoprolol succinate (TOPROL-XL)  omeprazole (PRILOSEC)    May take these medicines IF NEEDED: acetaminophen  (TYLENOL )    Follow your surgeon's instructions on when to stop Aspirin.  If no instructions were given by your surgeon then you will need to call the office to get those instructions.     One week prior to surgery, STOP taking any Aleve, Naproxen, Ibuprofen, Motrin, Advil, Goody's, BC's, all herbal medications, fish oil, and non-prescription vitamins.                     Do NOT Smoke  (Tobacco/Vaping) for 24 hours prior to your procedure.  If you use a CPAP at night, you may bring your mask/headgear for your overnight stay.   You will be asked to remove any contacts, glasses, piercing's, hearing aid's, dentures/partials prior to surgery. Please bring cases for these items if needed.    Patients discharged the day of surgery will not be allowed to drive home, and someone needs to stay with them for 24 hours.  SURGICAL WAITING ROOM VISITATION Patients may have no more than 2 support people in the waiting area - these visitors may rotate.   Pre-op nurse will coordinate an appropriate time for 1 ADULT support person, who may not rotate, to accompany patient in pre-op.  Children under the age of 62 must have an adult with them who is not the patient and must remain in the main waiting area with an adult.  If the patient needs to stay at the hospital during part of their recovery, the visitor guidelines for inpatient rooms apply.  Please refer to the [facility] website for the visitor guidelines for any additional information.   If you received a COVID test during your pre-op visit  it is requested that you wear a mask when out in public, stay away from anyone that may not be feeling well and notify your surgeon if you develop symptoms. If you have been in contact with anyone that has tested positive in the last  10 days please notify you surgeon.      Pre-operative CHG Bathing Instructions   You can play a key role in reducing the risk of infection after surgery. Your skin needs to be as free of germs as possible. You can reduce the number of germs on your skin by washing with CHG (chlorhexidine gluconate) soap before surgery. CHG is an antiseptic soap that kills germs and continues to kill germs even after washing.   DO NOT use if you have an allergy to chlorhexidine/CHG or antibacterial soaps. If your skin becomes reddened or irritated, stop using the CHG and notify one of  our RNs at [phone].              TAKE A SHOWER THE NIGHT BEFORE SURGERY AND THE DAY OF SURGERY    Please keep in mind the following:  DO NOT shave, including legs and underarms, 48 hours prior to surgery.   You may shave your face before/day of surgery.  Place clean sheets on your bed the night before surgery Use a clean washcloth (not used since being washed) for each shower. DO NOT sleep with pet's night before surgery.  CHG Shower Instructions:  Wash your face and private area with normal soap. If you choose to wash your hair, wash first with your normal shampoo.  After you use shampoo/soap, rinse your hair and body thoroughly to remove shampoo/soap residue.  Turn the water OFF and apply half the bottle of CHG soap to a CLEAN washcloth.  Apply CHG soap ONLY FROM YOUR NECK DOWN TO YOUR TOES (washing for 3-5 minutes)  DO NOT use CHG soap on face, private areas, open wounds, or sores.  Pay special attention to the area where your surgery is being performed.  If you are having back surgery, having someone wash your back for you may be helpful. Wait 2 minutes after CHG soap is applied, then you may rinse off the CHG soap.  Pat dry with a clean towel  Put on clean pajamas    Additional instructions for the day of surgery: DO NOT APPLY any lotions, deodorants, cologne, or perfumes.   Do not wear jewelry or makeup Do not wear nail polish, gel polish, artificial nails, or any other type of covering on natural nails (fingers and toes) Do not bring valuables to the hospital. [facility] is not responsible for valuables/personal belongings. Put on clean/comfortable clothes.  Please brush your teeth.  Ask your nurse before applying any prescription medications to the skin.

## 2023-08-17 ENCOUNTER — Ambulatory Visit
Admission: RE | Admit: 2023-08-17 | Discharge: 2023-08-17 | Disposition: A | Discharge status: Home / Self Care | Source: Ambulatory Visit | Attending: General Surgery | Admitting: General Surgery

## 2023-08-17 ENCOUNTER — Encounter (HOSPITAL_COMMUNITY)
Admission: RE | Admit: 2023-08-17 | Discharge: 2023-08-17 | Disposition: A | Discharge status: Home / Self Care | Source: Ambulatory Visit | Attending: General Surgery | Admitting: General Surgery

## 2023-08-17 ENCOUNTER — Other Ambulatory Visit: Payer: Self-pay

## 2023-08-17 ENCOUNTER — Encounter (HOSPITAL_COMMUNITY): Payer: Self-pay

## 2023-08-17 VITALS — BP 132/69 | HR 72 | Temp 98.1°F | Resp 17 | Ht 63.0 in | Wt 134.0 lb

## 2023-08-17 DIAGNOSIS — I493 Ventricular premature depolarization: Secondary | ICD-10-CM | POA: Diagnosis not present

## 2023-08-17 DIAGNOSIS — I2510 Atherosclerotic heart disease of native coronary artery without angina pectoris: Secondary | ICD-10-CM | POA: Insufficient documentation

## 2023-08-17 DIAGNOSIS — Z170 Estrogen receptor positive status [ER+]: Secondary | ICD-10-CM

## 2023-08-17 DIAGNOSIS — Z01818 Encounter for other preprocedural examination: Secondary | ICD-10-CM | POA: Diagnosis present

## 2023-08-17 DIAGNOSIS — I472 Ventricular tachycardia, unspecified: Secondary | ICD-10-CM | POA: Diagnosis not present

## 2023-08-17 DIAGNOSIS — E785 Hyperlipidemia, unspecified: Secondary | ICD-10-CM | POA: Diagnosis not present

## 2023-08-17 DIAGNOSIS — Z79899 Other long term (current) drug therapy: Secondary | ICD-10-CM | POA: Insufficient documentation

## 2023-08-17 DIAGNOSIS — I351 Nonrheumatic aortic (valve) insufficiency: Secondary | ICD-10-CM | POA: Diagnosis not present

## 2023-08-17 HISTORY — PX: BREAST BIOPSY: SHX20

## 2023-08-17 HISTORY — DX: Unspecified osteoarthritis, unspecified site: M19.90

## 2023-08-17 HISTORY — DX: Hypothyroidism, unspecified: E03.9

## 2023-08-17 LAB — CBC
HCT: 41.2 % (ref 36.0–46.0)
Hemoglobin: 13.4 g/dL (ref 12.0–15.0)
MCH: 31.6 pg (ref 26.0–34.0)
MCHC: 32.5 g/dL (ref 30.0–36.0)
MCV: 97.2 fL (ref 80.0–100.0)
Platelets: 219 10*3/uL (ref 150–400)
RBC: 4.24 MIL/uL (ref 3.87–5.11)
RDW: 13.2 % (ref 11.5–15.5)
WBC: 6.3 10*3/uL (ref 4.0–10.5)
nRBC: 0 % (ref 0.0–0.2)

## 2023-08-17 LAB — BASIC METABOLIC PANEL WITH GFR
Anion gap: 9 (ref 5–15)
BUN: 9 mg/dL (ref 8–23)
CO2: 28 mmol/L (ref 22–32)
Calcium: 9.6 mg/dL (ref 8.9–10.3)
Chloride: 102 mmol/L (ref 98–111)
Creatinine, Ser: 0.74 mg/dL (ref 0.44–1.00)
GFR, Estimated: >60 mL/min (ref >60)
Glucose, Bld: 95 mg/dL (ref 70–99)
Potassium: 4.6 mmol/L (ref 3.5–5.1)
Sodium: 139 mmol/L (ref 135–145)

## 2023-08-17 NOTE — Progress Notes (Addendum)
 PCP - Dr. Monique Ano Cardiologist - Dr. Burney Carter - Last office visit 12/31/2022  PPM/ICD - Denies Device Orders - n/a Rep Notified - n/a  Chest x-ray - 04/16/2023 (CE) EKG - 08/17/2023 Stress Test - 11/19/2020 (CE) ECHO - 11/19/2020 (CE) Cardiac Cath - Denies  Sleep Study - Denies CPAP - n/a  No DM  Last dose of GLP1 agonist- n/a GLP1 instructions: n/a  Blood Thinner Instructions: n/a Aspirin Instructions: Pt instructed to contact surgeon's office for ASA instructions. Her last dose of ASA currently was last night, May 5th.  ERAS Protcol - Clear liquids until 0745 morning of surgery PRE-SURGERY Ensure or G2- Ensure given to pt with instructions  COVID TEST- n/a   Anesthesia review: Yes. Breast seed placement 5/6 at 1130 and borderline EKG review. Hx of symptomatic PVCs/PACs and palpitations.   Patient denies shortness of breath, fever, cough and chest pain at PAT appointment. Pt denies any respiratory illness/infection in the last two months.    All instructions explained to the patient, with a verbal understanding of the material. Patient agrees to go over the instructions while at home for a better understanding. Patient also instructed to self quarantine after being tested for COVID-19. The opportunity to ask questions was provided.

## 2023-08-18 ENCOUNTER — Inpatient Hospital Stay: Attending: Oncology

## 2023-08-18 DIAGNOSIS — C50412 Malignant neoplasm of upper-outer quadrant of left female breast: Secondary | ICD-10-CM

## 2023-08-18 DIAGNOSIS — Z170 Estrogen receptor positive status [ER+]: Secondary | ICD-10-CM

## 2023-08-18 NOTE — Progress Notes (Signed)
 Anesthesia Chart Review:  83 year old female follows with cardiologist Dr. Beau Bound at [place] [facility] for history of symptomatic PACs/PVCs, NSVT, HLD.  Echo 11/2020 showed normal biventricular function, mild aortic regurgitation.  Nuclear stress 11/2020 showed normal perfusion.  72-hour Holter 10/2020 showed mostly sinus rhythm, frequent PACs, frequent PVCs, short NSVT run of 17 beats, no pauses or high-grade blocks.  She is maintained on metoprolol.  Last seen 12/31/2022 noted to be stable at that time, no changes to management, 1 year follow-up recommended.  Other pertinent history includes hypothyroidism, GERD on PPI.  Preop labs reviewed, WNL.  EKG 08/17/2023: Normal sinus rhythm.  Rate 72. Low voltage QRS  Echocardiogram 2D complete: (11/19/2020) INTERPRETATION NORMAL LEFT VENTRICULAR SYSTOLIC FUNCTION NORMAL RIGHT VENTRICULAR SYSTOLIC FUNCTION MILD VALVULAR REGURGITATION (See above) NO VALVULAR STENOSIS IRREGULAR HEART RHYTHM CAPTURED THROUGHOUT EXAM ESTIMATED LVEF >55% Aortic: MILD AI Mitral: TRIVIAL MR Tricuspid: TRIVIAL TR Pulmonic: TRIVIAL PI  NM Myocardial Perfusion SPECT multiple (stress and rest): (11/19/2020) IMPRESSION: Normal myocardial perfusion scan no evidence of stress-induced myocardial ischemia ejection fraction of 76% conclusion negative scan    72-hour Holter (10/28/2020) Indication palpitation Hookup date 6/29 through July 2,2022 Patient wore the Holter for about 3 days  Total beats 324,568 Minimum rate 50 maximum 169 average 75 Mostly sinus rhythm Frequent PACs Frequent PVCs Short nonsustained ventricular run of 17 beats No pauses No high-grade blocks No ST segment changes No diary submitted  Conclusion Mostly sinus rhythm Frequent PACs frequent PVCs Short nonsustained run of VT Consider beta-blockade therapy to help with symptom management and control     Edilia Gordon [facility] Short Stay Center/Anesthesiology Phone [phone] 08/18/2023 9:51  AM

## 2023-08-18 NOTE — Progress Notes (Signed)
 Multidisciplinary Oncology Council Documentation  ANNETA SCHROFF was presented by our [facility] on 08/18/2023, which included representatives from:  Palliative Care Dietitian  Physical/Occupational Therapist Nurse Navigator Genetics Social work Survivorship RN Financial Navigator Research RN   Nijha currently presents with history of breast cancer  We reviewed previous medical and familial history, history of present illness, and recent lab results along with all available histopathologic and imaging studies. The MOC considered available treatment options and made the following recommendations/referrals:  Rehab screening, SW, genetics  The MOC is a meeting of clinicians from various specialty areas who evaluate and discuss patients for whom a multidisciplinary approach is being considered. Final determinations in the plan of care are those of the provider(s).   Today's extended care, comprehensive team conference, Jimya was not present for the discussion and was not examined.

## 2023-08-18 NOTE — Anesthesia Preprocedure Evaluation (Signed)
 Anesthesia Evaluation  Patient identified by MRN, date of birth, ID band Patient awake    Reviewed: Allergy & Precautions, NPO status , Patient's Chart, lab work & pertinent test results  Airway Mallampati: II  TM Distance: >3 FB Neck ROM: Full    Dental  (+) Chipped,    Pulmonary neg pulmonary ROS   Pulmonary exam normal        Cardiovascular Normal cardiovascular exam+ dysrhythmias      Neuro/Psych negative neurological ROS     GI/Hepatic negative GI ROS, Neg liver ROS,,,  Endo/Other  Hypothyroidism    Renal/GU negative Renal ROS     Musculoskeletal  (+) Arthritis ,    Abdominal   Peds  Hematology negative hematology ROS (+)   Anesthesia Other Findings LEFT BREAST CANCER  Reproductive/Obstetrics                             Anesthesia Physical Anesthesia Plan  ASA: 2  Anesthesia Plan: General   Post-op Pain Management:    Induction: Intravenous  PONV Risk Score and Plan: 3 and Ondansetron, Dexamethasone and Treatment may vary due to age or medical condition  Airway Management Planned: LMA  Additional Equipment:   Intra-op Plan:   Post-operative Plan: Extubation in OR  Informed Consent: I have reviewed the patients History and Physical, chart, labs and discussed the procedure including the risks, benefits and alternatives for the proposed anesthesia with the patient or authorized representative who has indicated his/her understanding and acceptance.     Dental advisory given  Plan Discussed with: CRNA  Anesthesia Plan Comments: (PAT note by Rudy Costain, PA-C: 83 year old female follows with cardiologist Dr. Beau Bound at [place] [facility] for history of symptomatic PACs/PVCs, NSVT, HLD.  Echo 11/2020 showed normal biventricular function, mild aortic regurgitation.  Nuclear stress 11/2020 showed normal perfusion.  72-hour Holter 10/2020 showed mostly sinus rhythm, frequent PACs,  frequent PVCs, short NSVT run of 17 beats, no pauses or high-grade blocks.  She is maintained on metoprolol.  Last seen 12/31/2022 noted to be stable at that time, no changes to management, 1 year follow-up recommended.  Other pertinent history includes hypothyroidism, GERD on PPI.  Preop labs reviewed, WNL.  EKG 08/17/2023: Normal sinus rhythm.  Rate 72. Low voltage QRS  Echocardiogram 2D complete: (11/19/2020) INTERPRETATION NORMAL LEFT VENTRICULAR SYSTOLIC FUNCTION NORMAL RIGHT VENTRICULAR SYSTOLIC FUNCTION MILD VALVULAR REGURGITATION (See above) NO VALVULAR STENOSIS IRREGULAR HEART RHYTHM CAPTURED THROUGHOUT EXAM ESTIMATED LVEF >55% Aortic: MILD AI Mitral: TRIVIAL MR Tricuspid: TRIVIAL TR Pulmonic: TRIVIAL PI  NM Myocardial Perfusion SPECT multiple (stress and rest): (11/19/2020) IMPRESSION: Normal myocardial perfusion scan no evidence of stress-induced myocardial ischemia ejection fraction of 76% conclusion negative scan    72-hour Holter (10/28/2020) Indication palpitation Hookup date 6/29 through July 2,2022 Patient wore the Holter for about 3 days  Total beats 324,568 Minimum rate 50 maximum 169 average 75 Mostly sinus rhythm Frequent PACs Frequent PVCs Short nonsustained ventricular run of 17 beats No pauses No high-grade blocks No ST segment changes No diary submitted  Conclusion Mostly sinus rhythm Frequent PACs frequent PVCs Short nonsustained run of VT Consider beta-blockade therapy to help with symptom management and control     )        Anesthesia Quick Evaluation

## 2023-08-19 ENCOUNTER — Ambulatory Visit
Admission: RE | Admit: 2023-08-19 | Discharge: 2023-08-19 | Disposition: A | Discharge status: Home / Self Care | Source: Ambulatory Visit | Attending: General Surgery | Admitting: General Surgery

## 2023-08-19 ENCOUNTER — Ambulatory Visit (HOSPITAL_COMMUNITY): Payer: Self-pay | Admitting: Physician Assistant

## 2023-08-19 ENCOUNTER — Ambulatory Visit (HOSPITAL_BASED_OUTPATIENT_CLINIC_OR_DEPARTMENT_OTHER): Admitting: Anesthesiology

## 2023-08-19 ENCOUNTER — Other Ambulatory Visit: Payer: Self-pay

## 2023-08-19 ENCOUNTER — Ambulatory Visit (HOSPITAL_COMMUNITY)
Admission: RE | Admit: 2023-08-19 | Discharge: 2023-08-19 | Disposition: A | Discharge status: Home / Self Care | Attending: General Surgery | Admitting: General Surgery

## 2023-08-19 ENCOUNTER — Encounter (HOSPITAL_COMMUNITY): Payer: Self-pay | Admitting: General Surgery

## 2023-08-19 ENCOUNTER — Encounter (HOSPITAL_COMMUNITY)
Admission: RE | Disposition: A | Discharge status: Home / Self Care | Payer: Self-pay | Source: Home / Self Care | Attending: General Surgery

## 2023-08-19 DIAGNOSIS — Z1732 Human epidermal growth factor receptor 2 negative status: Secondary | ICD-10-CM | POA: Insufficient documentation

## 2023-08-19 DIAGNOSIS — C50412 Malignant neoplasm of upper-outer quadrant of left female breast: Secondary | ICD-10-CM | POA: Diagnosis present

## 2023-08-19 DIAGNOSIS — Z1721 Progesterone receptor positive status: Secondary | ICD-10-CM | POA: Insufficient documentation

## 2023-08-19 DIAGNOSIS — Z170 Estrogen receptor positive status [ER+]: Secondary | ICD-10-CM | POA: Diagnosis not present

## 2023-08-19 DIAGNOSIS — C50912 Malignant neoplasm of unspecified site of left female breast: Secondary | ICD-10-CM

## 2023-08-19 DIAGNOSIS — E039 Hypothyroidism, unspecified: Secondary | ICD-10-CM | POA: Diagnosis not present

## 2023-08-19 DIAGNOSIS — Z803 Family history of malignant neoplasm of breast: Secondary | ICD-10-CM | POA: Diagnosis not present

## 2023-08-19 DIAGNOSIS — M1990 Unspecified osteoarthritis, unspecified site: Secondary | ICD-10-CM | POA: Diagnosis not present

## 2023-08-19 HISTORY — PX: BREAST LUMPECTOMY WITH RADIOACTIVE SEED LOCALIZATION: SHX6424

## 2023-08-19 SURGERY — BREAST LUMPECTOMY WITH RADIOACTIVE SEED LOCALIZATION
Anesthesia: General | Site: Breast | Laterality: Left

## 2023-08-19 MED ORDER — FENTANYL CITRATE (PF) 100 MCG/2ML IJ SOLN
25.0000 ug | INTRAMUSCULAR | Status: DC | PRN
Start: 2023-08-19 — End: 2023-08-19

## 2023-08-19 MED ORDER — FENTANYL CITRATE (PF) 250 MCG/5ML IJ SOLN
INTRAMUSCULAR | Status: DC | PRN
  Administered 2023-08-19: 50 ug via INTRAVENOUS

## 2023-08-19 MED ORDER — ACETAMINOPHEN 500 MG PO TABS
1000.0000 mg | ORAL_TABLET | ORAL | Status: AC
  Administered 2023-08-19: 1000 mg via ORAL
  Filled 2023-08-19: qty 2

## 2023-08-19 MED ORDER — CEFAZOLIN SODIUM-DEXTROSE 2-4 GM/100ML-% IV SOLN
2.0000 g | INTRAVENOUS | Status: AC
Start: 2023-08-19 — End: 2023-08-19
  Administered 2023-08-19: 2 g via INTRAVENOUS
  Filled 2023-08-19: qty 100

## 2023-08-19 MED ORDER — CHLORHEXIDINE GLUCONATE 0.12 % MT SOLN
15.0000 mL | Freq: Once | OROMUCOSAL | Status: AC
  Administered 2023-08-19: 15 mL via OROMUCOSAL
  Filled 2023-08-19: qty 15

## 2023-08-19 MED ORDER — 0.9 % SODIUM CHLORIDE (POUR BTL) OPTIME
TOPICAL | Status: DC | PRN
  Administered 2023-08-19: 1000 mL

## 2023-08-19 MED ORDER — ENSURE PRE-SURGERY PO LIQD: 296.0000 mL | Freq: Once | ORAL | Status: DC

## 2023-08-19 MED ORDER — ONDANSETRON HCL 4 MG/2ML IJ SOLN
INTRAMUSCULAR | Status: DC | PRN
  Administered 2023-08-19: 4 mg via INTRAVENOUS

## 2023-08-19 MED ORDER — AMISULPRIDE (ANTIEMETIC) 5 MG/2ML IV SOLN
10.0000 mg | Freq: Once | INTRAVENOUS | Status: DC | PRN
Start: 2023-08-19 — End: 2023-08-19

## 2023-08-19 MED ORDER — PROPOFOL 10 MG/ML IV BOLUS
INTRAVENOUS | Status: DC | PRN
Start: 2023-08-19 — End: 2023-08-19
  Administered 2023-08-19: 150 mg via INTRAVENOUS

## 2023-08-19 MED ORDER — ONDANSETRON HCL 4 MG/2ML IJ SOLN: 4.0000 mg | Freq: Once | INTRAMUSCULAR | Status: DC | PRN

## 2023-08-19 MED ORDER — CHLORHEXIDINE GLUCONATE CLOTH 2 % EX PADS: 6.0000 | MEDICATED_PAD | Freq: Once | CUTANEOUS | Status: DC

## 2023-08-19 MED ORDER — ORAL CARE MOUTH RINSE: 15.0000 mL | Freq: Once | OROMUCOSAL | Status: AC

## 2023-08-19 MED ORDER — LIDOCAINE HCL (CARDIAC) PF 100 MG/5ML IV SOSY
PREFILLED_SYRINGE | INTRAVENOUS | Status: DC | PRN
  Administered 2023-08-19: 60 mg via INTRATRACHEAL

## 2023-08-19 MED ORDER — EPHEDRINE SULFATE-NACL 50-0.9 MG/10ML-% IV SOSY
PREFILLED_SYRINGE | INTRAVENOUS | Status: DC | PRN
  Administered 2023-08-19: 5 mg via INTRAVENOUS
  Administered 2023-08-19: 2.5 mg via INTRAVENOUS
  Administered 2023-08-19: 5 mg via INTRAVENOUS

## 2023-08-19 MED ORDER — FENTANYL CITRATE (PF) 250 MCG/5ML IJ SOLN
INTRAMUSCULAR | Status: AC
Start: 2023-08-19 — End: ?
  Filled 2023-08-19: qty 5

## 2023-08-19 MED ORDER — BUPIVACAINE-EPINEPHRINE 0.25% -1:200000 IJ SOLN
INTRAMUSCULAR | Status: DC | PRN
  Administered 2023-08-19: 10 mL

## 2023-08-19 MED ORDER — LACTATED RINGERS IV SOLN: INTRAVENOUS | Status: DC

## 2023-08-19 MED ORDER — DEXAMETHASONE SODIUM PHOSPHATE 10 MG/ML IJ SOLN
INTRAMUSCULAR | Status: DC | PRN
  Administered 2023-08-19: 8 mg via INTRAVENOUS

## 2023-08-19 MED ORDER — CHLORHEXIDINE GLUCONATE CLOTH 2 % EX PADS
6.0000 | MEDICATED_PAD | Freq: Once | CUTANEOUS | Status: DC
Start: 2023-08-19 — End: 2023-08-19

## 2023-08-19 MED ORDER — BUPIVACAINE-EPINEPHRINE (PF) 0.25% -1:200000 IJ SOLN
INTRAMUSCULAR | Status: AC
Start: 2023-08-19 — End: ?
  Filled 2023-08-19: qty 30

## 2023-08-19 MED ORDER — PHENYLEPHRINE 80 MCG/ML (10ML) SYRINGE FOR IV PUSH (FOR BLOOD PRESSURE SUPPORT)
PREFILLED_SYRINGE | INTRAVENOUS | Status: DC | PRN
  Administered 2023-08-19: 80 ug via INTRAVENOUS

## 2023-08-19 SURGICAL SUPPLY — 33 items
BAG COUNTER SPONGE SURGICOUNT (BAG) ×1 IMPLANT
BINDER BREAST LRG (GAUZE/BANDAGES/DRESSINGS) IMPLANT
BINDER BREAST XLRG (GAUZE/BANDAGES/DRESSINGS) IMPLANT
CANISTER SUCTION 3000ML PPV (SUCTIONS) ×1 IMPLANT
CHLORAPREP W/TINT 26 (MISCELLANEOUS) ×1 IMPLANT
CLIP APPLIE 9.375 MED OPEN (MISCELLANEOUS) IMPLANT
CLIP TI MEDIUM 6 (CLIP) IMPLANT
COVER PROBE W GEL 5X96 (DRAPES) ×1 IMPLANT
COVER SURGICAL LIGHT HANDLE (MISCELLANEOUS) ×1 IMPLANT
DERMABOND ADVANCED .7 DNX12 (GAUZE/BANDAGES/DRESSINGS) ×1 IMPLANT
DEVICE DUBIN SPECIMEN MAMMOGRA (MISCELLANEOUS) ×1 IMPLANT
DRAPE CHEST BREAST 15X10 FENES (DRAPES) ×1 IMPLANT
ELECT COATED BLADE 2.86 ST (ELECTRODE) ×1 IMPLANT
ELECTRODE REM PT RTRN 9FT ADLT (ELECTROSURGICAL) ×1 IMPLANT
GLOVE BIO SURGEON STRL SZ7 (GLOVE) ×2 IMPLANT
GLOVE BIOGEL PI IND STRL 7.5 (GLOVE) ×1 IMPLANT
GOWN STRL REUS W/ TWL LRG LVL3 (GOWN DISPOSABLE) ×2 IMPLANT
KIT BASIN OR (CUSTOM PROCEDURE TRAY) ×1 IMPLANT
KIT MARKER MARGIN INK (KITS) ×1 IMPLANT
LIGHT WAVEGUIDE WIDE FLAT (MISCELLANEOUS) IMPLANT
NDL HYPO 25GX1X1/2 BEV (NEEDLE) ×1 IMPLANT
NEEDLE HYPO 25GX1X1/2 BEV (NEEDLE) ×1 IMPLANT
NS IRRIG 1000ML POUR BTL (IV SOLUTION) ×1 IMPLANT
PACK GENERAL/GYN (CUSTOM PROCEDURE TRAY) ×1 IMPLANT
STRIP CLOSURE SKIN 1/2X4 (GAUZE/BANDAGES/DRESSINGS) ×1 IMPLANT
SUT MNCRL AB 4-0 PS2 18 (SUTURE) ×1 IMPLANT
SUT MON AB 5-0 PS2 18 (SUTURE) IMPLANT
SUT SILK 2 0 SH (SUTURE) IMPLANT
SUT VIC AB 2-0 SH 27XBRD (SUTURE) ×1 IMPLANT
SUT VIC AB 3-0 SH 27X BRD (SUTURE) ×1 IMPLANT
SYR CONTROL 10ML LL (SYRINGE) ×1 IMPLANT
TOWEL GREEN STERILE (TOWEL DISPOSABLE) ×1 IMPLANT
TOWEL GREEN STERILE FF (TOWEL DISPOSABLE) ×1 IMPLANT

## 2023-08-19 NOTE — Transfer of Care (Signed)
 Immediate Anesthesia Transfer of Care Note  Patient: Erica Donaldson  Procedure(s) Performed: BREAST LUMPECTOMY WITH RADIOACTIVE SEED LOCALIZATION (Left: Breast)  Patient Location: PACU  Anesthesia Type:General  Level of Consciousness: awake and alert   Airway & Oxygen Therapy: Patient Spontanous Breathing and Patient connected to nasal cannula oxygen  Post-op Assessment: Report given to RN and Post -op Vital signs reviewed and stable  Post vital signs: Reviewed and stable  Last Vitals:  Vitals Value Taken Time  BP 119/65 08/19/23 1200  Temp 36.4 C 08/19/23 1156  Pulse 69 08/19/23 1200  Resp 11 08/19/23 1200  SpO2 97 % 08/19/23 1200    Last Pain:  Vitals:   08/19/23 1156  PainSc: 0-No pain         Complications: No notable events documented.

## 2023-08-19 NOTE — Discharge Instructions (Signed)
 [facility] Office Phone Number [phone]  POST OP INSTRUCTIONS Take 400 mg of ibuprofen every 8 hours or 650 mg tylenol every 6 hours for next 72 hours then as needed. Use ice several times daily also.  A prescription for pain medication may be given to you upon discharge.  Take your pain medication as prescribed, if needed.  If narcotic pain medicine is not needed, then you may take acetaminophen (Tylenol), naprosyn (Alleve) or ibuprofen (Advil) as needed. Take your usually prescribed medications unless otherwise directed If you need a refill on your pain medication, please contact your pharmacy.  They will contact our office to request authorization.  Prescriptions will not be filled after 5pm or on week-ends. You should eat very light the first 24 hours after surgery, such as soup, crackers, pudding, etc.  Resume your normal diet the day after surgery. Most patients will experience some swelling and bruising in the breast.  Ice packs and a good support bra will help.  Wear the breast binder provided or a sports bra for 72 hours day and night.  After that wear a sports bra during the day until you return to the office. Swelling and bruising can take several days to resolve.  It is common to experience some constipation if taking pain medication after surgery.  Increasing fluid intake and taking a stool softener will usually help or prevent this problem from occurring.  A mild laxative (Milk of Magnesia or Miralax) should be taken according to package directions if there are no bowel movements after 48 hours. I used skin glue on the incision, you may shower in 24 hours.  The glue will flake off over the next 2-3 weeks.  Any sutures or staples will be removed at the office during your follow-up visit. ACTIVITIES:  You may resume regular daily activities (gradually increasing) beginning the next day.  Wearing a good support bra or sports bra minimizes pain and swelling.  You may have  sexual intercourse when it is comfortable. You may drive when you no longer are taking prescription pain medication, you can comfortably wear a seatbelt, and you can safely maneuver your car and apply brakes. RETURN TO WORK:  ______________________________________________________________________________________ Erica Donaldson should see your doctor in the office for a follow-up appointment approximately two weeks after your surgery.  Your doctor's nurse will typically make your follow-up appointment when she calls you with your pathology report.  Expect your pathology report 3-4 business days after your surgery.  You may call to check if you do not hear from [place] after three days. OTHER INSTRUCTIONS: _______________________________________________________________________________________________ _____________________________________________________________________________________________________________________________________ _____________________________________________________________________________________________________________________________________ _____________________________________________________________________________________________________________________________________  WHEN TO CALL DR Alaija Ruble: Fever over 101.0 Nausea and/or vomiting. Extreme swelling or bruising. Continued bleeding from incision. Increased pain, redness, or drainage from the incision.  The clinic staff is available to answer your questions during regular business hours.  Please don't hesitate to call and ask to speak to one of the nurses for clinical concerns.  If you have a medical emergency, go to the nearest emergency room or call 911.  A surgeon from [facility] is always on call at the hospital.  For further questions, please visit [email or web address]

## 2023-08-19 NOTE — Anesthesia Procedure Notes (Signed)
 Procedure Name: LMA Insertion Date/Time: 08/19/2023 10:38 AM  Performed by: Alphia Jasmine, CRNAPre-anesthesia Checklist: Patient identified, Emergency Drugs available, Suction available, Timeout performed and Patient being monitored Patient Re-evaluated:Patient Re-evaluated prior to induction Oxygen Delivery Method: Circle system utilized Preoxygenation: Pre-oxygenation with 100% oxygen Induction Type: IV induction Ventilation: Mask ventilation without difficulty LMA: LMA inserted LMA Size: 4.0 Tube type: Oral Placement Confirmation: positive ETCO2, CO2 detector and breath sounds checked- equal and bilateral Tube secured with: Tape Dental Injury: Teeth and Oropharynx as per pre-operative assessment

## 2023-08-19 NOTE — Interval H&P Note (Signed)
 History and Physical Interval Note:  08/19/2023 10:24 AM  Erica Donaldson  has presented today for surgery, with the diagnosis of LEFT BREAST CANCER.  The various methods of treatment have been discussed with the patient and family. After consideration of risks, benefits and other options for treatment, the patient has consented to  Procedure(s) with comments: BREAST LUMPECTOMY WITH RADIOACTIVE SEED LOCALIZATION (Left) - LEFT BREAST SEED GUIDED LUMPECTOMY as a surgical intervention.  The patient's history has been reviewed, patient examined, no change in status, stable for surgery.  I have reviewed the patient's chart and labs.  Questions were answered to the patient's satisfaction.     Enid Harry

## 2023-08-19 NOTE — H&P (Signed)
 83 year old female who has a prior history of a right breast benign biopsy. She has a family history in a couple of aunts. She is seen by medical oncology already. She had a screening mammogram that shows B density breast tissue with a possible left breast mass. She underwent diagnostic views with a persistent 5 mm focal asymmetry in the upper outer quadrant. Ultrasound confirms a 5 x 4 x 2 mm mass. Left axillary ultrasound demonstrates no abnormal lymph nodes. A biopsy of the mass is done. 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 No additional pathology is done at this site. She has no mass or discharge noted. She is here with her husband. She is retired.  Review of Systems: A complete review of systems was obtained from the patient. I have reviewed this information and discussed as appropriate with the patient. See HPI as well for other ROS.  Review of Systems  HENT: Positive for hearing loss and tinnitus.  Cardiovascular: Positive for palpitations.  All other systems reviewed and are negative.  Medical History: Past Medical History:  Diagnosis Date  Arthritis  History of cancer  Hypertension  Thyroid disease   Patient Active Problem List  Diagnosis  Intermittent palpitations on Toprol - followed by Dr. 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  Vaginal atrophy   Past Surgical History:  Procedure Laterality Date  APPENDECTOMY  Benign right breast lumpectomy   HYSTERECTOMY   Allergies  Allergen Reactions  Sulfa (Sulfonamide Antibiotics) Rash   Current Outpatient Medications on File Prior to Visit  Medication Sig Dispense Refill  acetaminophen  (TYLENOL ) 500 MG tablet Take 1,000 mg by mouth as needed for Pain  aspirin 81 MG EC tablet Take 81 mg by mouth once daily  atorvastatin (LIPITOR) 10 MG tablet TAKE 1 TABLET BY MOUTH ONCE DAILY 90 tablet 3  calcium carbonate (CALCIUM 500 ORAL) Take 1,200 mg by mouth  cholecalciferol, vitamin D3, (VITAMIN D3) 125 mcg (5,000 unit) tablet Take 1 tablet by mouth once daily  cranberry fruit extract (CRANBERRY CONCENTRATE ORAL) Take by mouth once daily  cycloSPORINE (RESTASIS) 0.05 % ophthalmic emulsion Place 1 drop into both eyes 2 (two) times daily  denosumab (PROLIA) 60 mg/mL inj syringe Inject subcutaneously once Every 6 months  famotidine (PEPCID) 20 MG tablet TAKE 1 TABLET BY MOUTH TWICE DAILY AS NEEDED FOR HEARTBURN 200 tablet 1  Herbal Supplement Hair, Skin, and Nails (Biotin 5000mg )- 2 tablet by mouth once daily  levothyroxine (SYNTHROID) 25 MCG tablet TAKE 1 TABLET BY MOUTH ONCE DAILY ON AN EMPTY STOMACH WITH A GLASS OF WATER AT LEAST 30 TO 60 MINUTES BEFORE BREAKFAST 90 tablet 3  metoprolol succinate (TOPROL-XL) 50 MG XL tablet TAKE 1 TABLET BY MOUTH TWICE DAILY 180 tablet 3  multivitamin tablet Take 1 tablet by mouth once daily  omega-3 fatty acids/fish oil (FISH OIL EXTRA STRENGTH ORAL) Take by mouth once daily  omeprazole (PRILOSEC) 20 MG DR capsule Take 1 capsule (20 mg total) by mouth once daily 90 capsule 3  UNABLE TO FIND Med Name: collagen powder  10 g daily  Compound Medication E2 0.25mg /E3 1mg /Progesterone 100mg  in HRT base - apply 1mL topically once daily (60mL) 1 each 2  Compound Medication Testosterone in HRT base 5mg /mL 30mL pump jar - apply 1mL topically daily 1 each 3  estradioL  (ESTRACE ) 0.01 % (0.1 mg/gram) vaginal cream Place 1 g vaginally twice a week 3 times a week  vibegron  75 mg  Tab Take 75 mg by mouth once daily as needed 1 tablet 3 pills monthly   Family History  Problem Relation Age of Onset  Heart disease Mother  Myocardial Infarction (Heart attack) Father  Heart disease Father  Lung cancer Brother  Coronary Artery Disease (Blocked arteries around heart) Brother   Social History   Tobacco Use  Smoking Status Never  Passive exposure: Past  Smokeless Tobacco Never  Marital status: Married  Tobacco Use  Smoking status: Never  Passive exposure: Past  Smokeless tobacco: Never  Vaping Use  Vaping status: Never Used  Substance and Sexual Activity  Alcohol use: Never  Drug use: Never  Sexual activity: Not Currently  Partners: Male  Birth control/protection: Surgical  Social History Narrative  Marital Status- Married  Lives with husband  Employment- Self-employed  Exercise hx- Walks 1 hour daily  Religious Affiliation- [employer]   Objective:   Vitals:  08/10/23 1033  BP: 128/80  Pulse: 86  Weight: 59.9 kg (132 lb)  Height: 160 cm (5\' 3" )   Body mass index is 23.38 kg/m.  Physical Exam Vitals reviewed.  Constitutional:  Appearance: Normal appearance.  Chest:  Breasts: Right: No inverted nipple, mass or nipple discharge.  Left: No inverted nipple, mass or nipple discharge.  Lymphadenopathy:  Upper Body:  Right upper body: No supraclavicular or axillary adenopathy.  Left upper body: No supraclavicular or axillary adenopathy.  Neurological:  Mental Status: She is alert.   Assessment and Plan:   Malignant neoplasm of upper-outer quadrant of left breast in female, estrogen receptor positive (CMS/HHS-HCC)  Left breast seed guided lumpectomy  We discussed the staging and pathophysiology of breast cancer. We discussed all of the different options for treatment for breast cancer including surgery, chemotherapy, radiation therapy, and antiestrogen therapy. We discussed a sentinel lymph node biopsy and I do not think she needs on based on  Choosing Wisely, SOUND and INSEMA based on our local interpretation of guidelines.   We discussed the options for treatment of the breast cancer which included lumpectomy versus a mastectomy. We discussed the performance of the lumpectomy with radioactive seed placement. We discussed a 5-10% chance of a positive margin requiring reexcision in the operating room. We also discussed that she might get radiation therapy if she undergoes lumpectomy. We discussed mastectomy and the postoperative care for that as well. Mastectomy can be followed by reconstruction. The decision for lumpectomy vs mastectomy has no impact on decision for chemotherapy. Most mastectomy patients will not need radiation therapy. We discussed that there is no difference in her survival whether she undergoes lumpectomy with radiation therapy or antiestrogen therapy versus a mastectomy. There is also no real difference between her recurrence in the breast.  We discussed the risks of operation including bleeding, infection, possible reoperation. She understands her further therapy will be based on what her stages at the time of her operation.  Will obtain cardiac clearance due to pvcs/pacs.

## 2023-08-19 NOTE — Op Note (Signed)
  Preoperative diagnosis: Clinical stage I left breast cancer Postoperative diagnosis: Same as above Procedure: Left breast radioactive seed guided lumpectomy Surgeon: Dr. Donavan Fuchs Estimated blood loss: Minimal Anesthesia: General Complications: None Drains: None Specimens: None 1.  Left breast tissue containing seed and clip marked with paint 2. Left breast posterior margin marked short superior, long lateral, double deep Sponge needle count was correct at completion Disposition to recovery in stable condition   Indications:83 year old female who had a screening mammogram that shows B density breast tissue with a possible left breast mass. She underwent diagnostic views with a persistent 5 mm focal asymmetry in the upper outer quadrant. Ultrasound confirms a 5 x 4 x 2 mm mass. Left axillary ultrasound demonstrates no abnormal lymph nodes. A biopsy of the mass is done. 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  She was placed under general anesthesia without complication.  She was given antibiotics.  SCDs were in place.  She was prepped and draped in a standard sterile surgical fashion.  Surgical timeout was then performed.    I infiltrated Marcaine throughout the area and made a periareolar incision in order to hide the scar later.  I used the neoprobe to remove the seed and the surrounding tissue with an attempt to get a clear margin.  Mammogram confirmed removal of the seed and the clip.  The 3D images look like all my margins were clear but the posterior was close so I took some there. I then obtained hemostasis.  I closed the cavity with 2-0 Vicryl.  The skin was closed with 3-0 Vicryl and 5-0 Monocryl.  Glue and Steri-Strips  were applied.  She tolerated this well was extubated and transferred to recovery stable.

## 2023-08-20 ENCOUNTER — Encounter (HOSPITAL_COMMUNITY): Payer: Self-pay | Admitting: General Surgery

## 2023-08-20 NOTE — Anesthesia Postprocedure Evaluation (Signed)
 Anesthesia Post Note  Patient: Erica Donaldson  Procedure(s) Performed: BREAST LUMPECTOMY WITH RADIOACTIVE SEED LOCALIZATION (Left: Breast)     Patient location during evaluation: PACU Anesthesia Type: General Level of consciousness: awake Pain management: pain level controlled Vital Signs Assessment: post-procedure vital signs reviewed and stable Respiratory status: spontaneous breathing, nonlabored ventilation and respiratory function stable Cardiovascular status: blood pressure returned to baseline and stable Postop Assessment: no apparent nausea or vomiting Anesthetic complications: no   No notable events documented.  Last Vitals:  Vitals:   08/19/23 1156 08/19/23 1200  BP: 122/69 119/65  Pulse: 71 69  Resp: 14 11  Temp: 36.4 C   SpO2: 94% 97%    Last Pain:  Vitals:   08/19/23 1156  PainSc: 0-No pain                 Donavyn Fecher P Nastassia Bazaldua

## 2023-08-23 ENCOUNTER — Inpatient Hospital Stay

## 2023-08-23 LAB — SURGICAL PATHOLOGY

## 2023-08-23 NOTE — Progress Notes (Signed)
 [facility] Clinical Social Work  Initial Assessment   Erica Donaldson is a 83 y.o. year old female contacted by phone. Clinical Social Work was referred by [facility] for assessment of psychosocial needs.   SDOH (Social Determinants of Health) assessments performed: Yes   SDOH Screenings   Food Insecurity: No Food Insecurity (07/27/2023)  Housing: Low Risk  (07/27/2023)  Transportation Needs: No Transportation Needs (07/27/2023)  Utilities: Not At Risk (07/27/2023)  Depression (PHQ2-9): Low Risk  (07/27/2023)  Financial Resource Strain: Low Risk  (03/30/2023)   Received from [facility]  Tobacco Use: Low Risk  (08/19/2023)     Distress Screen completed: No    07/27/2023    2:33 PM  ONCBCN DISTRESS SCREENING  Screening Type Initial Screening  How much distress have you been experiencing in the past week? (0-10) 0      Family/Social Information:  Housing Arrangement: patient lives with her husband. Family members/support persons in your life? Family and [employer] Financial concerns: no  Employment: Retired  Income source: Actor concerns: No Type of concern: None Food access concerns: no Religious or spiritual practice: Yes-Patient reports having a church family. Advanced directives: No Services Currently in place:  Medicare  Coping/ Adjustment to diagnosis: Patient understands treatment plan and what happens next? yes Concerns about diagnosis and/or treatment: I'm not especially worried about anything Patient reported stressors: None Hopes and/or priorities: Family and faith. Patient enjoys time with family/ friends Current coping skills/ strengths: Active sense of humor , Capable of independent living , Communication skills , Financial means , General fund of knowledge , Motivation for treatment/growth , Religious Affiliation , and Supportive family/friends     SUMMARY: Current SDOH Barriers:  None per patient.  Clinical Social  Work Clinical Goal(s):  No clinical social work goals at this time  Interventions: Discussed common feeling and emotions when being diagnosed with cancer, and the importance of support during treatment Informed patient of the support team roles and support services at [facility] Provided CSW contact information and encouraged patient to call with any questions or concerns Provided patient with information about CSW role and the [employer].   Follow Up Plan: Patient will contact CSW with any support or resource needs Patient verbalizes understanding of plan: Yes    Erica Jesus Sanayah Munro, LCSW Clinical Social Worker [facility]

## 2023-08-24 ENCOUNTER — Encounter: Payer: Self-pay | Admitting: *Deleted

## 2023-08-24 NOTE — Progress Notes (Signed)
 Called Erica Donaldson to discuss if she would like oncotype ordered and would she consider chemotherapy if it comes back a high score.  She decided she would like the test to be ordered.  Oncotype order [id number] ordered online.

## 2023-08-31 ENCOUNTER — Inpatient Hospital Stay

## 2023-08-31 ENCOUNTER — Inpatient Hospital Stay (HOSPITAL_BASED_OUTPATIENT_CLINIC_OR_DEPARTMENT_OTHER): Admitting: Licensed Clinical Social Worker

## 2023-08-31 ENCOUNTER — Encounter: Payer: Self-pay | Admitting: Licensed Clinical Social Worker

## 2023-08-31 ENCOUNTER — Other Ambulatory Visit: Payer: Self-pay | Admitting: Licensed Clinical Social Worker

## 2023-08-31 DIAGNOSIS — Z803 Family history of malignant neoplasm of breast: Secondary | ICD-10-CM | POA: Diagnosis not present

## 2023-08-31 DIAGNOSIS — Z170 Estrogen receptor positive status [ER+]: Secondary | ICD-10-CM

## 2023-08-31 DIAGNOSIS — Z8041 Family history of malignant neoplasm of ovary: Secondary | ICD-10-CM | POA: Diagnosis not present

## 2023-08-31 DIAGNOSIS — C50412 Malignant neoplasm of upper-outer quadrant of left female breast: Secondary | ICD-10-CM

## 2023-08-31 DIAGNOSIS — Z800 Family history of malignant neoplasm of digestive organs: Secondary | ICD-10-CM

## 2023-08-31 LAB — GENETIC SCREENING ORDER

## 2023-08-31 NOTE — Progress Notes (Signed)
 REFERRING PROVIDER: Avonne Boettcher, MD [address] [place],  [place] [address]  PRIMARY PROVIDER:  Monique Ano, MD  PRIMARY REASON FOR VISIT:  1. Malignant neoplasm of upper-outer quadrant of left breast in female, estrogen receptor positive (HCC)   2. Family history of breast cancer   3. Family history of ovarian cancer   4. Family history of colon cancer     HISTORY OF PRESENT ILLNESS:   Ms. Winiecki, a 83 y.o. female, was seen for a [place] cancer genetics consultation at the request of Dr. Randy Buttery due to a personal and family history of cancer.  Ms. Sprunger presents to clinic today to discuss the possibility of a hereditary predisposition to cancer, genetic testing, and to further clarify her future cancer risks, as well as potential cancer risks for family members.    CANCER HISTORY:  Oncology History  Malignant neoplasm of upper-outer quadrant of left breast in female, estrogen receptor positive (HCC)  07/27/2023 Initial Diagnosis   Malignant neoplasm of upper-outer quadrant of left breast in female, estrogen receptor positive (HCC)   07/27/2023 Cancer Staging   Staging form: Breast, AJCC 8th Edition - Clinical stage from 07/27/2023: Stage IA (cT1a, cN0, cM0, G2, ER+, PR+, HER2-) - Signed by Avonne Boettcher, MD on 07/27/2023 Histologic grading system: 3 grade system    In 2025, at the age of 78, Ms. Holness was diagnosed with left breast cancer. The treatment plan includes lumpectomy completed 5/8, Oncotype, adjuvant radiation.   RISK FACTORS:  Menarche was at age 37.  First live birth at age 84.  Ovaries intact: no.  Hysterectomy: yes.  Menopausal status: postmenopausal.  Colonoscopy: yes; 2023 normal.   Past Medical History:  Diagnosis Date   Arthritis    Breast cancer (HCC)    Dysrhythmia    Palpitations, PVCs, PACs   Hypothyroidism    Overactive bladder    Skin cancer    Thyroid disease     Past Surgical History:  Procedure Laterality Date    ABDOMINAL HYSTERECTOMY  1973   APPENDECTOMY  1951   BREAST BIOPSY Left 07/20/2023   US  LT BREAST BX W LOC DEV 1ST LESION IMG BX SPEC US  GUIDE 07/20/2023 [facility]   BREAST BIOPSY  08/17/2023   MM LT RADIOACTIVE SEED LOC MAMMO GUIDE 08/17/2023 [facility]   BREAST EXCISIONAL BIOPSY Right    1960's 2 areas excised   BREAST LUMPECTOMY WITH RADIOACTIVE SEED LOCALIZATION Left 08/19/2023   Procedure: BREAST LUMPECTOMY WITH RADIOACTIVE SEED LOCALIZATION;  Surgeon: Enid Harry, MD;  Location: [facility] OR;  Service: General;  Laterality: Left;  LEFT BREAST SEED GUIDED LUMPECTOMY   CATARACT EXTRACTION W/ INTRAOCULAR LENS IMPLANT Bilateral    COLONOSCOPY      FAMILY HISTORY:  We obtained a detailed, 4-generation family history.  Significant diagnoses are listed below: Family History  Problem Relation Age of Onset   Congestive Heart Failure Mother    Heart Problems Father    COPD Sister    Cancer Brother    Lung cancer Brother    Heart Problems Brother    Cancer Maternal Aunt    Breast cancer Maternal Aunt    Breast cancer Paternal Aunt    Cancer Paternal Grandmother    Uterine cancer Paternal Grandmother    Ms. Vogler has 1 son, 52. She has 1 living brother and 1 deceased brother, he had lung cancer and died at 16. She has 1 living sister.   Ms. Jobst mother died at 86. Patient's  maternal aunt had breast and colon cancer in her 44s. Another aunt had ovarian cancer. A maternal cousin had lung cancer. A maternal uncle had throat cancer.  Ms. Yaden father died at 50. Patient's paternal aunt had breast cancer in her 63s and passed in her 73s. No other known cancers on this side of the family.  Ms. Taha is unaware of previous family history of genetic testing for hereditary cancer risks. There is no reported Ashkenazi Jewish ancestry. There is no known consanguinity.    GENETIC COUNSELING ASSESSMENT: Ms. Herskowitz is a 83 y.o. female with a personal and family history of breast  cancer and family history of ovarian cancer which is somewhat suggestive of a hereditary cancer syndrome and predisposition to cancer. We, therefore, discussed and recommended the following at today's visit.   DISCUSSION: We discussed that approximately 10% of breast cancer is hereditary. Most cases of hereditary breast cancer are associated with BRCA1/2 genes, although there are other genes associated with hereditary cancer as well. Cancers and risks are gene specific. We discussed that testing is beneficial for several reasons including knowing about cancer risks, identifying potential screening and risk-reduction options that may be appropriate, and to understand if other family members could be at risk for cancer and allow them to undergo genetic testing.   We reviewed the characteristics, features and inheritance patterns of hereditary cancer syndromes. We also discussed genetic testing, including the appropriate family members to test, the process of testing, insurance coverage and turn-around-time for results. We discussed the implications of a negative, positive and/or variant of uncertain significant result. We recommended Ms. Annabell Key pursue genetic testing for the [facility] CancerNext+RNA gene panel.   Based on Ms. Hollingshead's personal and family history of cancer, she meets medical criteria for genetic testing. Despite that she meets criteria, she may still have an out of pocket cost.   PLAN: After considering the risks, benefits, and limitations, Ms. Canny provided informed consent to pursue genetic testing and the blood sample was sent to [facility] for analysis of the CancerNext+RNA panel. Results should be available within approximately 3 weeks' time, at which point they will be disclosed by telephone to Ms. Annabell Key, as will any additional recommendations warranted by these results. Ms. Alia will receive a summary of her genetic counseling visit and a copy of her results once available. This  information will also be available in Epic.   Ms. Katich questions were answered to her satisfaction today. Our contact information was provided should additional questions or concerns arise. Thank you for the referral and allowing us  to share in the care of your patient.   Valri Gee, MS, [facility] Genetic Counselor [email or web address] Phone: [phone]  40 minutes were spent on the date of the encounter in service to the patient including preparation, face-to-face consultation, documentation and care coordination. Dr. Nelson Bandy was available for discussion regarding this case.   _______________________________________________________________________ For Office Staff:  Number of people involved in session: 1 Was an Intern/ student involved with case: no

## 2023-09-08 ENCOUNTER — Encounter: Payer: Self-pay | Admitting: Oncology

## 2023-09-09 ENCOUNTER — Encounter (HOSPITAL_COMMUNITY): Payer: Self-pay

## 2023-09-15 ENCOUNTER — Inpatient Hospital Stay: Admitting: Occupational Therapy

## 2023-09-15 ENCOUNTER — Encounter: Payer: Self-pay | Admitting: Radiation Oncology

## 2023-09-15 ENCOUNTER — Ambulatory Visit
Admission: RE | Admit: 2023-09-15 | Discharge: 2023-09-15 | Disposition: A | Discharge status: Home / Self Care | Source: Ambulatory Visit | Attending: Radiation Oncology | Admitting: Radiation Oncology

## 2023-09-15 ENCOUNTER — Encounter: Payer: Self-pay | Admitting: Oncology

## 2023-09-15 ENCOUNTER — Inpatient Hospital Stay: Attending: Oncology | Admitting: Oncology

## 2023-09-15 VITALS — BP 120/74 | HR 75 | Resp 16 | Ht 63.0 in | Wt 135.2 lb

## 2023-09-15 VITALS — BP 120/74 | HR 75 | Temp 98.8°F | Resp 16 | Ht 63.0 in | Wt 135.0 lb

## 2023-09-15 DIAGNOSIS — Z8041 Family history of malignant neoplasm of ovary: Secondary | ICD-10-CM | POA: Diagnosis not present

## 2023-09-15 DIAGNOSIS — C50412 Malignant neoplasm of upper-outer quadrant of left female breast: Secondary | ICD-10-CM | POA: Diagnosis present

## 2023-09-15 DIAGNOSIS — Z800 Family history of malignant neoplasm of digestive organs: Secondary | ICD-10-CM | POA: Diagnosis not present

## 2023-09-15 DIAGNOSIS — Z79890 Hormone replacement therapy (postmenopausal): Secondary | ICD-10-CM | POA: Diagnosis not present

## 2023-09-15 DIAGNOSIS — Z1721 Progesterone receptor positive status: Secondary | ICD-10-CM | POA: Diagnosis not present

## 2023-09-15 DIAGNOSIS — Z801 Family history of malignant neoplasm of trachea, bronchus and lung: Secondary | ICD-10-CM | POA: Insufficient documentation

## 2023-09-15 DIAGNOSIS — Z170 Estrogen receptor positive status [ER+]: Secondary | ICD-10-CM | POA: Diagnosis not present

## 2023-09-15 DIAGNOSIS — Z85828 Personal history of other malignant neoplasm of skin: Secondary | ICD-10-CM | POA: Insufficient documentation

## 2023-09-15 DIAGNOSIS — Z90710 Acquired absence of both cervix and uterus: Secondary | ICD-10-CM | POA: Diagnosis not present

## 2023-09-15 DIAGNOSIS — N3281 Overactive bladder: Secondary | ICD-10-CM | POA: Diagnosis not present

## 2023-09-15 DIAGNOSIS — Z803 Family history of malignant neoplasm of breast: Secondary | ICD-10-CM | POA: Insufficient documentation

## 2023-09-15 DIAGNOSIS — Z7982 Long term (current) use of aspirin: Secondary | ICD-10-CM | POA: Insufficient documentation

## 2023-09-15 DIAGNOSIS — Z79899 Other long term (current) drug therapy: Secondary | ICD-10-CM | POA: Diagnosis not present

## 2023-09-15 DIAGNOSIS — Z1732 Human epidermal growth factor receptor 2 negative status: Secondary | ICD-10-CM | POA: Diagnosis not present

## 2023-09-15 DIAGNOSIS — E039 Hypothyroidism, unspecified: Secondary | ICD-10-CM | POA: Insufficient documentation

## 2023-09-15 DIAGNOSIS — Z7189 Other specified counseling: Secondary | ICD-10-CM

## 2023-09-15 MED ORDER — ANASTROZOLE 1 MG PO TABS
1.0000 mg | ORAL_TABLET | Freq: Every day | ORAL | 2 refills | Status: DC
Start: 2023-09-15 — End: 2023-11-12

## 2023-09-15 NOTE — Progress Notes (Signed)
 [employer] handout provided to patient.

## 2023-09-15 NOTE — Therapy (Signed)
 [facility] - A Dept Of [place]. [facility] [address] [place], [place], [address] Phone: [phone]   Fax:  [phone]  Occupational Therapy Screen  Patient Details  Name: Erica Donaldson MRN: [id number] Date of Birth: 01/05/1941 No data recorded  Encounter Date: 09/15/2023   OT End of Session - 09/15/23 1708     Visit Number 0             Past Medical History:  Diagnosis Date   Arthritis    Breast cancer (HCC)    Dysrhythmia    Palpitations, PVCs, PACs   Hypothyroidism    Overactive bladder    Skin cancer    Thyroid disease     Past Surgical History:  Procedure Laterality Date   ABDOMINAL HYSTERECTOMY  1973   APPENDECTOMY  1951   BREAST BIOPSY Left 07/20/2023   US  LT BREAST BX W LOC DEV 1ST LESION IMG BX SPEC US  GUIDE 07/20/2023 [facility]   BREAST BIOPSY  08/17/2023   MM LT RADIOACTIVE SEED LOC MAMMO GUIDE 08/17/2023 [facility]   BREAST EXCISIONAL BIOPSY Right    1960's 2 areas excised   BREAST LUMPECTOMY WITH RADIOACTIVE SEED LOCALIZATION Left 08/19/2023   Procedure: BREAST LUMPECTOMY WITH RADIOACTIVE SEED LOCALIZATION;  Surgeon: Enid Harry, MD;  Location: [facility] OR;  Service: General;  Laterality: Left;  LEFT BREAST SEED GUIDED LUMPECTOMY   CATARACT EXTRACTION W/ INTRAOCULAR LENS IMPLANT Bilateral    COLONOSCOPY      There were no vitals filed for this visit.     Dr Randy Buttery notes 09/15/23: Chief complaint/ Reason for visit-discuss pathology results and further management   Heme/Onc history: patient is a 83 year old female with a past medical history Significant for hypothyroidism and overactive bladder.  She underwent a screening mammogram in March 2025 which showed a possible mass in the left breast.  This was followed by diagnostic mammogram and an ultrasound which showed irregular hypoechoic mass at the 1 o'clock position 2 cm from the nipple measuring 5 x 4 x 2 mm.  No left axillary  adenopathy.  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   Final left lumpectomy pathologyShe from 08/19/2023 showed a 0.6 cm grade 2 invasive mammary carcinoma with negative margins.  Sentinel lymph node biopsy not performed given her age.  Oncotype testing came back at 8 and therefore she would not benefit from adjuvant chemotherapy.  She has met with radiation oncology and will decide if she wants to pursue radiation or not.  Baseline bone density scan showed osteopenia which was improved as compared to previous osteoporosis.  She gets Prolia through her primary care Dr. Jerone Moorman.      OT SCREEN 09/15/23: Patient referred to OT screen postop left lumpectomy by Dr. Delane Fear on 08/19/2023. No lymph nodes removed.  Was already seen by Dr. Delane Fear and released with no limitations. Patient bilateral shoulder active range of motion within normal limits. Patient denying any discomfort or pain. See Dr. Denese Finn note for plan of care Strength test and shoulder in all planes below 90 degrees 5/5 Patient used to workout at the [facility] 3 times a week doing cardio and weights. Patient has been doing cardio but no weights. Likes to be active around the house. Reviewed with patient that she can gradually increase activity working out.  Low weights and reps and sets and gradually increase pain-free. No further need for OT at this time patient can contact breast navigator if needs follow-up.                                      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 [facility] [address] [place], [place], [address] Phone: [phone]   Fax:  [phone]  Name: BRODIE SCOVELL MRN: [id number] Date of Birth: 10-Jul-1940

## 2023-09-15 NOTE — Consult Note (Signed)
 NEW PATIENT EVALUATION  Name: Erica Donaldson  MRN: [id number]  Date:   09/15/2023     DOB: 09/09/1940   This 83 y.o. 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  REFERRING PHYSICIAN: Avonne Boettcher, MD  CHIEF COMPLAINT:  Chief Complaint  Patient presents with   Breast Cancer    DIAGNOSIS: The encounter diagnosis was Malignant neoplasm of upper-outer quadrant of left female breast, unspecified estrogen receptor status (HCC).   PREVIOUS INVESTIGATIONS:  Mammogram and ultrasound reviewed Clinical notes reviewed Pathology reports reviewed  HPI: Patient is an 82 year old female who presents with an abnormal screening mammogram.  Dedicated diagnostic mammogram confirmed a 5 mm irregular mass at the 1 o'clock position which was hypoechoic suspicious for malignancy.  No left axillary adenopathy was identified.  Patient did 1 targeted barrette biopsy which was positive for invasive mammary carcinoma overall grade 2.  She went on to have a wide local excision for a 0.6 cm overall grade 2 invasive mammary carcinoma.  All margins were negative by 10 mm for her invasive component margins were close at less than 1 mm for DCIS component.  No regional lymph nodes or sentinel nodes were submitted for examination.  Tumor was strongly ER/PR positive.  She has done well postoperatively.  Patient's Oncotype DX came back with low risk of recurrence she will not have systemic treatment.  She specifically denies breast tenderness cough or bone pain.  PLANNED TREATMENT REGIMEN: Left hypofractionated whole breast radiation plus photon boost  PAST MEDICAL HISTORY:  has a past medical history of Arthritis, Breast cancer (HCC), Dysrhythmia, Hypothyroidism, Overactive bladder, Skin cancer, and Thyroid disease.    PAST SURGICAL HISTORY:  Past Surgical History:  Procedure Laterality Date    ABDOMINAL HYSTERECTOMY  1973   APPENDECTOMY  1951   BREAST BIOPSY Left 07/20/2023   US  LT BREAST BX W LOC DEV 1ST LESION IMG BX SPEC US  GUIDE 07/20/2023 [facility]   BREAST BIOPSY  08/17/2023   MM LT RADIOACTIVE SEED LOC MAMMO GUIDE 08/17/2023 [facility]   BREAST EXCISIONAL BIOPSY Right    1960's 2 areas excised   BREAST LUMPECTOMY WITH RADIOACTIVE SEED LOCALIZATION Left 08/19/2023   Procedure: BREAST LUMPECTOMY WITH RADIOACTIVE SEED LOCALIZATION;  Surgeon: Enid Harry, MD;  Location: [facility] OR;  Service: General;  Laterality: Left;  LEFT BREAST SEED GUIDED LUMPECTOMY   CATARACT EXTRACTION W/ INTRAOCULAR LENS IMPLANT Bilateral    COLONOSCOPY      FAMILY HISTORY: family history includes Breast cancer in her maternal aunt and paternal aunt; COPD in her sister; Colon cancer in her maternal aunt; Congestive Heart Failure in her mother; Heart Problems in her brother and father; Lung cancer in her brother; Ovarian cancer in her maternal aunt; Throat cancer in her maternal uncle.  SOCIAL HISTORY:  reports that she has never smoked. She has never used smokeless tobacco. She reports that she does not drink alcohol and does not use drugs.  ALLERGIES: Sulfa antibiotics  MEDICATIONS:  Current Outpatient Medications  Medication Sig Dispense Refill   acetaminophen  (TYLENOL ) 500 MG tablet Take 500-1,000 mg by mouth every 6 (six) hours as needed (pain.).     Apoaequorin (PREVAGEN PO) Take 1 tablet by mouth in the morning.     Ascorbic Acid (VITAMIN C PO) Take 1 tablet by mouth every evening.     aspirin EC 81 MG tablet Take 81 mg by  mouth daily. Swallow whole.     atorvastatin (LIPITOR) 10 MG tablet Take 10 mg by mouth daily at 6 PM.     B Complex-C (B-COMPLEX WITH VITAMIN C) tablet Take 1 tablet by mouth daily with lunch.     BIOTIN PO Take 1 tablet by mouth in the morning.     CALCIUM PO Take 1 tablet by mouth every evening.     Cholecalciferol (VITAMIN D3 PO) Take 5,000 Units by mouth in  the morning.     Coenzyme Q10 (COQ10 PO) Take 1 capsule by mouth daily with lunch.     Cranberry 400 MG TABS Take 400 mg by mouth daily with lunch.     denosumab (PROLIA) 60 MG/ML SOSY injection Inject 60 mg into the skin every 6 (six) months.     Docusate Calcium (STOOL SOFTENER PO) Take 1 capsule by mouth at bedtime.     ELDERBERRY PO Take 2 each by mouth in the morning.     levothyroxine (SYNTHROID) 25 MCG tablet Take 25 mcg by mouth every evening.     MAGNESIUM PO Take 1 capsule by mouth in the morning and at bedtime.     metoprolol succinate (TOPROL-XL) 50 MG 24 hr tablet Take 50 mg by mouth 2 (two) times daily.     Multiple Vitamins-Minerals (ADULT ONE DAILY GUMMIES PO) Take 2 tablets by mouth in the morning. Women's Multivitamin     Omega-3 Fatty Acids (OMEGA 3 PO) Take 1 capsule by mouth daily with lunch. Super Omega 3     omeprazole (PRILOSEC) 20 MG capsule Take 20 mg by mouth daily before breakfast.     OVER THE COUNTER MEDICATION Take 2 capsules by mouth daily with lunch. [facility] MD-Bare Feet     Probiotic Product (PROBIOTIC PO) Take 1 capsule by mouth in the morning.     TURMERIC PO Take 900 mg by mouth in the morning and at bedtime.     Vibegron  75 MG TABS Take 75 mg by mouth daily. (Patient taking differently: Take 75 mg by mouth See admin instructions. Take 1 tablet (75 mg) by mouth 3 times a month) 90 tablet 3   zinc sulfate, 50mg  elemental zinc, 220 (50 Zn) MG capsule Take 220 mg by mouth in the morning.     No current facility-administered medications for this encounter.    ECOG PERFORMANCE STATUS:  0 - Asymptomatic  REVIEW OF SYSTEMS: Patient denies any weight loss, fatigue, weakness, fever, chills or night sweats. Patient denies any loss of vision, blurred vision. Patient denies any ringing  of the ears or hearing loss. No irregular heartbeat. Patient denies heart murmur or history of fainting. Patient denies any chest pain or pain radiating to her upper extremities.  Patient denies any shortness of breath, difficulty breathing at night, cough or hemoptysis. Patient denies any swelling in the lower legs. Patient denies any nausea vomiting, vomiting of blood, or coffee ground material in the vomitus. Patient denies any stomach pain. Patient states has had normal bowel movements no significant constipation or diarrhea. Patient denies any dysuria, hematuria or significant nocturia. Patient denies any problems walking, swelling in the joints or loss of balance. Patient denies any skin changes, loss of hair or loss of weight. Patient denies any excessive worrying or anxiety or significant depression. Patient denies any problems with insomnia. Patient denies excessive thirst, polyuria, polydipsia. Patient denies any swollen glands, patient denies easy bruising or easy bleeding. Patient denies any recent infections, allergies or URI.  Patient "s visual fields have not changed significantly in recent time.   PHYSICAL EXAM: BP 120/74   Pulse 75   Resp 16   Ht 5\' 3"  (1.6 m)   Wt 135 lb 3.2 oz (61.3 kg)   BMI 23.95 kg/m  Patient is status post wide local excision of the left breast incision is well-healed.  No dominant masses noted in either breast no axillary or supraclavicular adenopathy is appreciated.  Well-developed well-nourished patient in NAD. HEENT reveals PERLA, EOMI, discs not visualized.  Oral cavity is clear. No oral mucosal lesions are identified. Neck is clear without evidence of cervical or supraclavicular adenopathy. Lungs are clear to A&P. Cardiac examination is essentially unremarkable with regular rate and rhythm without murmur rub or thrill. Abdomen is benign with no organomegaly or masses noted. Motor sensory and DTR levels are equal and symmetric in the upper and lower extremities. Cranial nerves II through XII are grossly intact. Proprioception is intact. No peripheral adenopathy or edema is identified. No motor or sensory levels are noted. Crude visual  fields are within normal range.  LABORATORY DATA: Pathology reports reviewed    RADIOLOGY RESULTS: Mammogram and ultrasound reviewed compatible with above-stated findings   IMPRESSION: S stage T1b invasive mammary carcinoma left breast status post wide local excision with no sentinel node biopsy in 83 year old female ER/PR positive  PLAN: At this time I recommend a course of hypofractionated whole breast radiation to her left breast.  I would also boost her scar another 1600 centigrade using photon based on the close DCIS margin of less than 1 mm.  Risks and benefits of treatment occluding skin reaction fatigue alteration of blood counts possible inclusion of superficial lung all were reviewed in detail with the patient.  I have personally set up and ordered CT simulation for next week.  Patient also be candidate for endocrine therapy after completion of radiation.  Her Oncotype DX came back with a low recurrence risk score she will not have systemic treatment.  Patient comprehends my recommendations well.  I would like to take this opportunity to thank you for allowing me to participate in the care of your patient.Glenis Langdon, MD

## 2023-09-15 NOTE — Progress Notes (Signed)
 Hematology/Oncology Consult note [facility]  [phone] [phone]  Patient Care Team: Monique Ano, MD as PCP - General (Family Medicine) Waverly Hageman, RN as Oncology Nurse Navigator Avonne Boettcher, MD as Consulting Physician (Oncology) Glenis Langdon, MD as Consulting Physician (Radiation Oncology)   Name of the patient: Erica Donaldson  [id number]  1940/06/16   Date of visit: 09/15/23  Diagnosis-  Cancer Staging  Malignant neoplasm of upper-outer quadrant of left breast in female, estrogen receptor positive (HCC) Staging form: Breast, AJCC 8th Edition - Clinical stage from 07/27/2023: Stage IA (cT1a, cN0, cM0, G2, ER+, PR+, HER2-) - Signed by Avonne Boettcher, MD on 07/27/2023 Histologic grading system: 3 grade system - Pathologic stage from 09/15/2023: Stage IA (pT1b, pN0, cM0, G2, ER+, PR+, HER2-, Oncotype DX score: 8) - Signed by Avonne Boettcher, MD on 09/15/2023 Multigene prognostic tests performed: Oncotype DX Recurrence score range: Less than 11 Histologic grading system: 3 grade system    Chief complaint/ Reason for visit-discuss pathology results and further management  Heme/Onc history: patient is a 83 year old female with a past medical history Significant for hypothyroidism and overactive bladder.  She underwent a screening mammogram in March 2025 which showed a possible mass in the left breast.  This was followed by diagnostic mammogram and an ultrasound which showed irregular hypoechoic mass at the 1 o'clock position 2 cm from the nipple measuring 5 x 4 x 2 mm.  No left axillary adenopathy.  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   Final left lumpectomy pathologyShe from 08/19/2023 showed a 0.6 cm grade 2 invasive mammary carcinoma with negative margins.  Sentinel  lymph node biopsy not performed given her age.  Oncotype testing came back at 8 and therefore she would not benefit from adjuvant chemotherapy.  She has met with radiation oncology and will decide if she wants to pursue radiation or not.  Baseline bone density scan showed osteopenia which was improved as compared to previous osteoporosis.  She gets Prolia through her primary care Dr. Jerone Moorman.    Interval history-she is healing well from her lumpectomy presently.  Denies any specific complaints at this time  ECOG PS- 1 Pain scale- 0   Review of systems- Review of Systems  Constitutional:  Negative for chills, fever, malaise/fatigue and weight loss.  HENT:  Negative for congestion, ear discharge and nosebleeds.   Eyes:  Negative for blurred vision.  Respiratory:  Negative for cough, hemoptysis, sputum production, shortness of breath and wheezing.   Cardiovascular:  Negative for chest pain, palpitations, orthopnea and claudication.  Gastrointestinal:  Negative for abdominal pain, blood in stool, constipation, diarrhea, heartburn, melena, nausea and vomiting.  Genitourinary:  Negative for dysuria, flank pain, frequency, hematuria and urgency.  Musculoskeletal:  Negative for back pain, joint pain and myalgias.  Skin:  Negative for rash.  Neurological:  Negative for dizziness, tingling, focal weakness, seizures, weakness and headaches.  Endo/Heme/Allergies:  Does not bruise/bleed easily.  Psychiatric/Behavioral:  Negative for depression and suicidal ideas. The patient does not have insomnia.       Allergies  Allergen Reactions   Sulfa Antibiotics Rash     Past Medical History:  Diagnosis Date   Arthritis    Breast cancer (HCC)    Dysrhythmia    Palpitations, PVCs, PACs   Hypothyroidism  Overactive bladder    Skin cancer    Thyroid disease      Past Surgical History:  Procedure Laterality Date   ABDOMINAL HYSTERECTOMY  1973   APPENDECTOMY  1951   BREAST BIOPSY Left  07/20/2023   US  LT BREAST BX W LOC DEV 1ST LESION IMG BX SPEC US  GUIDE 07/20/2023 [facility]   BREAST BIOPSY  08/17/2023   MM LT RADIOACTIVE SEED LOC MAMMO GUIDE 08/17/2023 [facility]   BREAST EXCISIONAL BIOPSY Right    1960's 2 areas excised   BREAST LUMPECTOMY WITH RADIOACTIVE SEED LOCALIZATION Left 08/19/2023   Procedure: BREAST LUMPECTOMY WITH RADIOACTIVE SEED LOCALIZATION;  Surgeon: Enid Harry, MD;  Location: [facility] OR;  Service: General;  Laterality: Left;  LEFT BREAST SEED GUIDED LUMPECTOMY   CATARACT EXTRACTION W/ INTRAOCULAR LENS IMPLANT Bilateral    COLONOSCOPY      Social History   Socioeconomic History   Marital status: Married    Spouse name: Not on file   Number of children: Not on file   Years of education: Not on file   Highest education level: Not on file  Occupational History   Not on file  Tobacco Use   Smoking status: Never   Smokeless tobacco: Never  Vaping Use   Vaping status: Never Used  Substance and Sexual Activity   Alcohol use: No   Drug use: No   Sexual activity: Not Currently  Other Topics Concern   Not on file  Social History Narrative   Patient has a son that's 27 whom she's loves dearly!   Social Drivers of Corporate investment banker Strain: Low Risk  (03/30/2023)   [facility]   Overall Financial Resource Strain (CARDIA)    Difficulty of Paying Living Expenses: Not hard at all  Food Insecurity: No Food Insecurity (07/27/2023)   Hunger Vital Sign    Worried About Running Out of Food in the Last Year: Never true    Ran Out of Food in the Last Year: Never true  Transportation Needs: No Transportation Needs (07/27/2023)   PRAPARE - Administrator, Civil Service (Medical): No    Lack of Transportation (Non-Medical): No  Physical Activity: Not on file  Stress: No Stress Concern Present (08/23/2023)   [employer] of Occupational Health - Occupational Stress Questionnaire    Feeling  of Stress : Not at all  Social Connections: Not on file  Intimate Partner Violence: Not At Risk (07/27/2023)   Humiliation, Afraid, Rape, and Kick questionnaire    Fear of Current or Ex-Partner: No    Emotionally Abused: No    Physically Abused: No    Sexually Abused: No    Family History  Problem Relation Age of Onset   Congestive Heart Failure Mother    Heart Problems Father    COPD Sister    Lung cancer Brother    Heart Problems Brother    Breast cancer Maternal Aunt        dx 58s   Colon cancer Maternal Aunt        dx 70s   Ovarian cancer Maternal Aunt    Throat cancer Maternal Uncle    Breast cancer Paternal Aunt        dx 72s     Current Outpatient Medications:    anastrozole (ARIMIDEX) 1 MG tablet, Take 1 tablet (1 mg total) by mouth daily., Disp: 30 tablet, Rfl: 2   acetaminophen  (TYLENOL ) 500 MG tablet, Take 500-1,000  mg by mouth every 6 (six) hours as needed (pain.)., Disp: , Rfl:    Apoaequorin (PREVAGEN PO), Take 1 tablet by mouth in the morning., Disp: , Rfl:    Ascorbic Acid (VITAMIN C PO), Take 1 tablet by mouth every evening., Disp: , Rfl:    aspirin EC 81 MG tablet, Take 81 mg by mouth daily. Swallow whole., Disp: , Rfl:    atorvastatin (LIPITOR) 10 MG tablet, Take 10 mg by mouth daily at 6 PM., Disp: , Rfl:    B Complex-C (B-COMPLEX WITH VITAMIN C) tablet, Take 1 tablet by mouth daily with lunch., Disp: , Rfl:    BIOTIN PO, Take 1 tablet by mouth in the morning., Disp: , Rfl:    CALCIUM PO, Take 1 tablet by mouth every evening., Disp: , Rfl:    Cholecalciferol (VITAMIN D3 PO), Take 5,000 Units by mouth in the morning., Disp: , Rfl:    Coenzyme Q10 (COQ10 PO), Take 1 capsule by mouth daily with lunch., Disp: , Rfl:    Cranberry 400 MG TABS, Take 400 mg by mouth daily with lunch., Disp: , Rfl:    denosumab (PROLIA) 60 MG/ML SOSY injection, Inject 60 mg into the skin every 6 (six) months., Disp: , Rfl:    Docusate Calcium (STOOL SOFTENER PO), Take 1 capsule by  mouth at bedtime., Disp: , Rfl:    ELDERBERRY PO, Take 2 each by mouth in the morning., Disp: , Rfl:    levothyroxine (SYNTHROID) 25 MCG tablet, Take 25 mcg by mouth every evening., Disp: , Rfl:    MAGNESIUM PO, Take 1 capsule by mouth in the morning and at bedtime., Disp: , Rfl:    metoprolol succinate (TOPROL-XL) 50 MG 24 hr tablet, Take 50 mg by mouth 2 (two) times daily., Disp: , Rfl:    Multiple Vitamins-Minerals (ADULT ONE DAILY GUMMIES PO), Take 2 tablets by mouth in the morning. Women's Multivitamin, Disp: , Rfl:    Omega-3 Fatty Acids (OMEGA 3 PO), Take 1 capsule by mouth daily with lunch. Super Omega 3, Disp: , Rfl:    omeprazole (PRILOSEC) 20 MG capsule, Take 20 mg by mouth daily before breakfast., Disp: , Rfl:    OVER THE COUNTER MEDICATION, Take 2 capsules by mouth daily with lunch. [facility]-Bare Feet, Disp: , Rfl:    Probiotic Product (PROBIOTIC PO), Take 1 capsule by mouth in the morning., Disp: , Rfl:    TURMERIC PO, Take 900 mg by mouth in the morning and at bedtime., Disp: , Rfl:    Vibegron  75 MG TABS, Take 75 mg by mouth daily. (Patient taking differently: Take 75 mg by mouth See admin instructions. Take 1 tablet (75 mg) by mouth 3 times a month), Disp: 90 tablet, Rfl: 3   zinc sulfate, 50mg  elemental zinc, 220 (50 Zn) MG capsule, Take 220 mg by mouth in the morning., Disp: , Rfl:   Physical exam:  Vitals:   09/15/23 1410  BP: 120/74  Pulse: 75  Resp: 16  Temp: 98.8 F (37.1 C)  TempSrc: Tympanic  SpO2: 98%  Weight: 135 lb (61.2 kg)  Height: 5\' 3"  (1.6 m)   Physical Exam Cardiovascular:     Rate and Rhythm: Normal rate and regular rhythm.     Heart sounds: Normal heart sounds.  Pulmonary:     Effort: Pulmonary effort is normal.     Breath sounds: Normal breath sounds.  Skin:    General: Skin is warm and dry.  Neurological:  Mental Status: She is alert and oriented to person, place, and time.      I have personally reviewed labs listed below:     Latest Ref Rng & Units 08/17/2023   10:40 AM  CMP  Glucose 70 - 99 mg/dL 95   BUN 8 - 23 mg/dL 9   Creatinine 4.09 - 8.11 mg/dL 9.14   Sodium 782 - 956 mmol/L 139   Potassium 3.5 - 5.1 mmol/L 4.6   Chloride 98 - 111 mmol/L 102   CO2 22 - 32 mmol/L 28   Calcium 8.9 - 10.3 mg/dL 9.6       Latest Ref Rng & Units 08/17/2023   10:40 AM  CBC  WBC 4.0 - 10.5 K/uL 6.3   Hemoglobin 12.0 - 15.0 g/dL 21.3   Hematocrit 08.6 - 46.0 % 41.2   Platelets 150 - 400 K/uL 219    I have personally reviewed Radiology images listed below: No images are attached to the encounter.  MM Breast Surgical Specimen Result Date: 08/19/2023 CLINICAL DATA:  Post excision of a left breast lesion following radioactive seed localization. Assessed surgical specimen. EXAM: SPECIMEN RADIOGRAPH OF THE LEFT BREAST COMPARISON:  Previous exam(s). FINDINGS: Status post excision of the left breast. The radioactive seed and biopsy marker clip are present, completely intact, and were marked for pathology. IMPRESSION: Specimen radiograph of the left breast. Electronically Signed   By: Amanda Jungling M.D.   On: 08/19/2023 11:11   MM LT RADIOACTIVE SEED LOC MAMMO GUIDE Result Date: 08/17/2023 CLINICAL DATA:  Patient presents for mammographically guided seed localization a left breast carcinoma prior to surgical excision. EXAM: MAMMOGRAPHIC GUIDED RADIOACTIVE SEED LOCALIZATION OF THE LEFT BREAST COMPARISON:  Previous exam(s). FINDINGS: Patient presents for radioactive seed localization prior to surgical excision. I met with the patient and we discussed the procedure of seed localization including benefits and alternatives. We discussed the high likelihood of a successful procedure. We discussed the risks of the procedure including infection, bleeding, tissue injury and further surgery. We discussed the low dose of radioactivity involved in the procedure. Informed, written consent was given. The usual time-out protocol was performed immediately  prior to the procedure. Using mammographic guidance, sterile technique, 1% lidocaine  and an I-125 radioactive seed, the ribbon shaped post biopsy marker clip was localized using a superior approach. The follow-up mammogram images confirm the seed in the expected location and were marked for Dr. Delane Fear. Follow-up survey of the patient confirms presence of the radioactive seed. Order number of I-125 seed:  [id number]. Total activity:  0.239 millicuries.  Reference Date: 07/28/2023. The patient tolerated the procedure well and was [facility] from the [facility]. She was given instructions regarding seed removal. IMPRESSION: Radioactive seed localization of the left breast. No apparent complications. Electronically Signed   By: Amanda Jungling M.D.   On: 08/17/2023 11:54     Assessment and plan- Patient is a 83 y.o. female with history of pathological prognostic stage Ia invasive mammary carcinoma of the left breast pT1b N0 M0 ER/PR positive HER2 negative here to discuss further management  Oncotype score came back at 8 and therefore patient would not benefit from adjuvant chemotherapy.  She has met with radiation oncology today and she will think about whether she wants to undergo adjuvant radiation or not.  Given that her tumor was ER PR positive hormone therapy is indicated at this time.  Idiscussed the role for hormone therapy. Given that she is postmenopausal I would favor 5 years of  adjuvant hormone therapy with aromatase inhibitor. I discussed the risks and benefits of Arimidex including all but not limited to fatigue, hypercholesterolemia, hot flashes, arthralgias and worsening bone health.  Patient will also need to be on calcium 1200 mg along with vitamin D 800 international units.  Wwritten information about Arimidex given to the patient. I would like her to finish radiation therapy and start hormone therapy thereafter. Patient verbalized understanding and agrees to proceed.  Her baseline bone  density scan shows osteopenia which is improved as compared to prior osteoporosis.  She has been on Prolia through her primary care doctor.  At some point this will need to be discontinued given that she has been on it for more than 5 years.  I also discussed pros and cons of tamoxifen versus Arimidex and I do think ArimidexWould be a good choice for her given that her osteopenia is stable.  I will see her back in 3-1/2 months with CMP and 25-hydroxy vitamin D levels   Cancer Staging  Malignant neoplasm of upper-outer quadrant of left breast in female, estrogen receptor positive (HCC) Staging form: Breast, AJCC 8th Edition - Clinical stage from 07/27/2023: Stage IA (cT1a, cN0, cM0, G2, ER+, PR+, HER2-) - Signed by Avonne Boettcher, MD on 07/27/2023 Histologic grading system: 3 grade system - Pathologic stage from 09/15/2023: Stage IA (pT1b, pN0, cM0, G2, ER+, PR+, HER2-, Oncotype DX score: 8) - Signed by Avonne Boettcher, MD on 09/15/2023 Multigene prognostic tests performed: Oncotype DX Recurrence score range: Less than 11 Histologic grading system: 3 grade system       Visit Diagnosis 1. Malignant neoplasm of upper-outer quadrant of left breast in female, estrogen receptor positive (HCC)   2. Goals of care, counseling/discussion      Dr. Seretha Dance, MD, MPH [facility] at [facility] [phone] 09/15/2023 4:01 PM

## 2023-09-16 ENCOUNTER — Telehealth: Payer: Self-pay | Admitting: Licensed Clinical Social Worker

## 2023-09-16 ENCOUNTER — Encounter: Payer: Self-pay | Admitting: Licensed Clinical Social Worker

## 2023-09-16 ENCOUNTER — Ambulatory Visit: Payer: Self-pay | Admitting: Licensed Clinical Social Worker

## 2023-09-16 DIAGNOSIS — Z1379 Encounter for other screening for genetic and chromosomal anomalies: Secondary | ICD-10-CM

## 2023-09-16 NOTE — Progress Notes (Signed)
 HPI:   Ms. Stanbrough was previously seen in the [facility] due to a personal and family history of cancer and concerns regarding a hereditary predisposition to cancer. Please refer to our prior cancer genetics clinic note for more information regarding our discussion, assessment and recommendations, at the time. Ms. Bachtell recent genetic test results were disclosed to her, as were recommendations warranted by these results. These results and recommendations are discussed in more detail below.  CANCER HISTORY:  Oncology History  Malignant neoplasm of upper-outer quadrant of left breast in female, estrogen receptor positive (HCC)  07/27/2023 Initial Diagnosis   Malignant neoplasm of upper-outer quadrant of left breast in female, estrogen receptor positive (HCC)   07/27/2023 Cancer Staging   Staging form: Breast, AJCC 8th Edition - Clinical stage from 07/27/2023: Stage IA (cT1a, cN0, cM0, G2, ER+, PR+, HER2-) - Signed by Avonne Boettcher, MD on 07/27/2023 Histologic grading system: 3 grade system   09/15/2023 Cancer Staging   Staging form: Breast, AJCC 8th Edition - Pathologic stage from 09/15/2023: Stage IA (pT1b, pN0, cM0, G2, ER+, PR+, HER2-, Oncotype DX score: 8) - Signed by Avonne Boettcher, MD on 09/15/2023 Multigene prognostic tests performed: Oncotype DX Recurrence score range: Less than 11 Histologic grading system: 3 grade system    Genetic Testing   Negative genetic testing. No pathogenic variants identified on the [facility] CancerNext+RNA panel. The report date is 09/15/2023.  The [facility] CancerNext+RNAinsight Panel includes sequencing, rearrangement analysis, and RNA analysis for the following 40 genes: APC, ATM, BAP1, BARD1, BMPR1A, BRCA1, BRCA2, BRIP1, CDH1, CDKN2A, CHEK2, FH, FLCN, MET, MLH1, MSH2, MSH6, MUTYH, NF1, NTHL1, PALB2, PMS2, PTEN, RAD51C, RAD51D, RPS20, SMAD4, STK11, TP53, TSC1, TSC2, and VHL (sequencing and deletion/duplication); AXIN2, HOXB13, MBD4, MSH3, POLD1 and  POLE (sequencing only); EPCAM and GREM1 (deletion/duplication only).     FAMILY HISTORY:  We obtained a detailed, 4-generation family history.  Significant diagnoses are listed below: Family History  Problem Relation Age of Onset   Congestive Heart Failure Mother    Heart Problems Father    COPD Sister    Lung cancer Brother    Heart Problems Brother    Breast cancer Maternal Aunt        dx 25s   Colon cancer Maternal Aunt        dx 74s   Ovarian cancer Maternal Aunt    Throat cancer Maternal Uncle    Breast cancer Paternal Aunt        dx 23s      Ms. Armon has 1 son, 46. She has 1 living brother and 1 deceased brother, he had lung cancer and died at 59. She has 1 living sister.    Ms. Kuras mother died at 74. Patient's maternal aunt had breast and colon cancer in her 51s. Another aunt had ovarian cancer. A maternal cousin had lung cancer. A maternal uncle had throat cancer.   Ms. Cloyd father died at 74. Patient's paternal aunt had breast cancer in her 47s and passed in her 8s. No other known cancers on this side of the family.   Ms. Scheib is unaware of previous family history of genetic testing for hereditary cancer risks. There is no reported Ashkenazi Jewish ancestry. There is no known consanguinity.     GENETIC TEST RESULTS:  The [facility] CancerNext+RNA Panel found no pathogenic mutations.   The [facility] CancerNext+RNAinsight Panel includes sequencing, rearrangement analysis, and RNA analysis for the following 40 genes: APC, ATM, BAP1, BARD1, BMPR1A,  BRCA1, BRCA2, BRIP1, CDH1, CDKN2A, CHEK2, FH, FLCN, MET, MLH1, MSH2, MSH6, MUTYH, NF1, NTHL1, PALB2, PMS2, PTEN, RAD51C, RAD51D, RPS20, SMAD4, STK11, TP53, TSC1, TSC2, and VHL (sequencing and deletion/duplication); AXIN2, HOXB13, MBD4, MSH3, POLD1 and POLE (sequencing only); EPCAM and GREM1 (deletion/duplication only).   The test report has been scanned into EPIC and is located under the Molecular Pathology section of the  Results Review tab.  A portion of the result report is included below for reference. Genetic testing reported out on 09/15/2023.      Even though a pathogenic variant was not identified, possible explanations for the cancer in the family may include: There may be no hereditary risk for cancer in the family. The cancers in Ms. Kitchens and/or her family may be sporadic/familial or due to other genetic and environmental factors. There may be a gene mutation in one of these genes that current testing methods cannot detect but that chance is small. There could be another gene that has not yet been discovered, or that we have not yet tested, that is responsible for the cancer diagnoses in the family.  It is also possible there is a hereditary cause for the cancer in the family that Ms. Cermak did not inherit. Therefore, it is important to remain in touch with cancer genetics in the future so that we can continue to offer Ms. Sarkisyan the most up to date genetic testing.   ADDITIONAL GENETIC TESTING:  We discussed with Ms. Lodato that her genetic testing was fairly extensive.  If there are additional relevant genes identified to increase cancer risk that can be analyzed in the future, we would be happy to discuss and coordinate this testing at that time.    CANCER SCREENING RECOMMENDATIONS:  Ms. Nyquist test result is considered negative (normal).  This means that we have not identified a hereditary cause for her personal and family history of cancer at this time.   An individual's cancer risk and medical management are not determined by genetic test results alone. Overall cancer risk assessment incorporates additional factors, including personal medical history, family history, and any available genetic information that may result in a personalized plan for cancer prevention and surveillance. Therefore, it is recommended she continue to follow the cancer management and screening guidelines provided by her  oncology and primary healthcare provider.  RECOMMENDATIONS FOR FAMILY MEMBERS:   Since she did not inherit a identifiable mutation in a cancer predisposition gene included on this panel, her children could not have inherited a known mutation from her in one of these genes. Individuals in this family might be at some increased risk of developing cancer, over the general population risk, due to the family history of cancer.  Individuals in the family should notify their providers of the family history of cancer. We recommend women in this family have a yearly mammogram beginning at age 66, or 31 years younger than the earliest onset of cancer, an annual clinical breast exam, and perform monthly breast self-exams.  Family members should have colonoscopies by at age 54, or earlier, as recommended by their providers. Other members of the family may still carry a pathogenic variant in one of these genes that Ms. Lame did not inherit. Based on the family history, we recommend those related to her maternal aunt who had ovarian cancer have genetic counseling and testing. Ms. Varnell will let us  know if we can be of any assistance in coordinating genetic counseling and/or testing for this family member.  FOLLOW-UP:  Lastly, we discussed with Ms. Douthitt that cancer genetics is a rapidly advancing field and it is possible that new genetic tests will be appropriate for her and/or her family members in the future. We encouraged her to remain in contact with cancer genetics on an annual basis so we can update her personal and family histories and let her know of advances in cancer genetics that may benefit this family.   Our contact number was provided. Ms. Seidl questions were answered to her satisfaction, and she knows she is welcome to call us  at anytime with additional questions or concerns.    Valri Gee, MS, [facility] Genetic Counselor [email or web address] Phone: [phone]

## 2023-09-16 NOTE — Telephone Encounter (Signed)
 I contacted Ms. Mcilvaine to discuss her genetic testing results. No pathogenic variants were identified in the 40 genes analyzed. Detailed clinic note to follow.   The test report has been scanned into EPIC and is located under the Molecular Pathology section of the Results Review tab.  A portion of the result report is included below for reference.      Valri Gee, MS, [facility] Genetic Counselor [email or web address] Phone: [phone]

## 2023-09-28 ENCOUNTER — Ambulatory Visit

## 2023-09-29 ENCOUNTER — Encounter: Payer: Self-pay | Admitting: *Deleted

## 2023-11-11 ENCOUNTER — Other Ambulatory Visit: Payer: Self-pay | Admitting: Oncology

## 2023-11-11 DIAGNOSIS — C50412 Malignant neoplasm of upper-outer quadrant of left female breast: Secondary | ICD-10-CM

## 2023-12-27 ENCOUNTER — Other Ambulatory Visit: Payer: Self-pay

## 2023-12-27 DIAGNOSIS — C50412 Malignant neoplasm of upper-outer quadrant of left female breast: Secondary | ICD-10-CM

## 2023-12-28 ENCOUNTER — Encounter: Payer: Self-pay | Admitting: Oncology

## 2023-12-28 ENCOUNTER — Ambulatory Visit: Payer: Self-pay | Admitting: Oncology

## 2023-12-28 ENCOUNTER — Inpatient Hospital Stay

## 2023-12-28 ENCOUNTER — Inpatient Hospital Stay: Attending: Oncology | Admitting: Oncology

## 2023-12-28 VITALS — BP 122/64 | HR 78 | Temp 98.3°F | Resp 16 | Wt 129.0 lb

## 2023-12-28 DIAGNOSIS — E039 Hypothyroidism, unspecified: Secondary | ICD-10-CM | POA: Insufficient documentation

## 2023-12-28 DIAGNOSIS — Z08 Encounter for follow-up examination after completed treatment for malignant neoplasm: Secondary | ICD-10-CM

## 2023-12-28 DIAGNOSIS — C50412 Malignant neoplasm of upper-outer quadrant of left female breast: Secondary | ICD-10-CM | POA: Diagnosis present

## 2023-12-28 DIAGNOSIS — Z170 Estrogen receptor positive status [ER+]: Secondary | ICD-10-CM | POA: Insufficient documentation

## 2023-12-28 DIAGNOSIS — Z8041 Family history of malignant neoplasm of ovary: Secondary | ICD-10-CM | POA: Diagnosis not present

## 2023-12-28 DIAGNOSIS — Z800 Family history of malignant neoplasm of digestive organs: Secondary | ICD-10-CM | POA: Insufficient documentation

## 2023-12-28 DIAGNOSIS — Z803 Family history of malignant neoplasm of breast: Secondary | ICD-10-CM | POA: Diagnosis not present

## 2023-12-28 DIAGNOSIS — Z853 Personal history of malignant neoplasm of breast: Secondary | ICD-10-CM

## 2023-12-28 DIAGNOSIS — Z90710 Acquired absence of both cervix and uterus: Secondary | ICD-10-CM | POA: Insufficient documentation

## 2023-12-28 DIAGNOSIS — Z79899 Other long term (current) drug therapy: Secondary | ICD-10-CM

## 2023-12-28 DIAGNOSIS — Z5181 Encounter for therapeutic drug level monitoring: Secondary | ICD-10-CM | POA: Diagnosis not present

## 2023-12-28 DIAGNOSIS — Z1732 Human epidermal growth factor receptor 2 negative status: Secondary | ICD-10-CM | POA: Diagnosis not present

## 2023-12-28 DIAGNOSIS — Z1721 Progesterone receptor positive status: Secondary | ICD-10-CM | POA: Insufficient documentation

## 2023-12-28 DIAGNOSIS — M810 Age-related osteoporosis without current pathological fracture: Secondary | ICD-10-CM | POA: Diagnosis not present

## 2023-12-28 DIAGNOSIS — Z801 Family history of malignant neoplasm of trachea, bronchus and lung: Secondary | ICD-10-CM | POA: Diagnosis not present

## 2023-12-28 DIAGNOSIS — Z79811 Long term (current) use of aromatase inhibitors: Secondary | ICD-10-CM | POA: Insufficient documentation

## 2023-12-28 LAB — CMP (CANCER CENTER ONLY)
ALT: 19 U/L (ref 0–44)
AST: 29 U/L (ref 15–41)
Albumin: 3.8 g/dL (ref 3.5–5.0)
Alkaline Phosphatase: 63 U/L (ref 38–126)
Anion gap: 7 (ref 5–15)
BUN: 15 mg/dL (ref 8–23)
CO2: 25 mmol/L (ref 22–32)
Calcium: 9 mg/dL (ref 8.9–10.3)
Chloride: 103 mmol/L (ref 98–111)
Creatinine: 0.7 mg/dL (ref 0.44–1.00)
GFR, Estimated: >60 mL/min (ref >60)
Glucose, Bld: 109 mg/dL — ABNORMAL HIGH (ref 70–99)
Potassium: 4.1 mmol/L (ref 3.5–5.1)
Sodium: 135 mmol/L (ref 135–145)
Total Bilirubin: 0.7 mg/dL (ref 0.0–1.2)
Total Protein: 7.2 g/dL (ref 6.5–8.1)

## 2023-12-28 LAB — VITAMIN D 25 HYDROXY (VIT D DEFICIENCY, FRACTURES): Vit D, 25-Hydroxy: 106.4 ng/mL — ABNORMAL HIGH (ref 30–100)

## 2023-12-28 NOTE — Progress Notes (Signed)
 Hematology/Oncology Consult note [facility]  [phone] [phone]  Patient Care Team: Alla Amis, MD as PCP - General (Family Medicine) Georgina Shasta POUR, RN as Oncology Nurse Navigator Melanee Annah BROCKS, MD as Consulting Physician (Oncology) Lenn Aran, MD as Consulting Physician (Radiation Oncology)   Name of the patient: Erica Donaldson  [id number]  06/01/1940   Date of visit: 12/28/23  Diagnosis-  Cancer Staging  Malignant neoplasm of upper-outer quadrant of left breast in female, estrogen receptor positive (HCC) Staging form: Breast, AJCC 8th Edition - Clinical stage from 07/27/2023: Stage IA (cT1a, cN0, cM0, G2, ER+, PR+, HER2-) - Signed by Melanee Annah BROCKS, MD on 07/27/2023 Histologic grading system: 3 grade system - Pathologic stage from 09/15/2023: Stage IA (pT1b, pN0, cM0, G2, ER+, PR+, HER2-, Oncotype DX score: 8) - Signed by Melanee Annah BROCKS, MD on 09/15/2023 Multigene prognostic tests performed: Oncotype DX Recurrence score range: Less than 11 Histologic grading system: 3 grade system    Chief complaint/ Reason for visit-routine follow-up of breast cancer presently on Arimidex   Heme/Onc history: patient is a 83 year old female with a past medical history Significant for hypothyroidism and overactive bladder.  She underwent a screening mammogram in March 2025 which showed a possible mass in the left breast.  This was followed by diagnostic mammogram and an ultrasound which showed irregular hypoechoic mass at the 1 o'clock position 2 cm from the nipple measuring 5 x 4 x 2 mm.  No left axillary adenopathy.  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   Final left lumpectomy pathologyShe from 08/19/2023 showed a 0.6 cm grade 2 invasive mammary carcinoma with negative margins.   Sentinel lymph node biopsy not performed given her age.  Oncotype testing came back at 8 and therefore she would not benefit from adjuvant chemotherapy.  She has met with radiation oncology and will decide if she wants to pursue radiation or not.  Baseline bone density scan showed osteopenia which was improved as compared to previous osteoporosis.  She gets Prolia through her primary care Dr. Alla.  Patient started taking Arimidex  in June 2025.  She decided not to proceed with adjuvant radiation therapy  Interval history-tolerating Arimidex  well for her age without any significant side effects.  Denies any breast concerns at this time  ECOG PS- 1 Pain scale- 0   Review of systems- Review of Systems  Constitutional:  Negative for chills, fever, malaise/fatigue and weight loss.  HENT:  Negative for congestion, ear discharge and nosebleeds.   Eyes:  Negative for blurred vision.  Respiratory:  Negative for cough, hemoptysis, sputum production, shortness of breath and wheezing.   Cardiovascular:  Negative for chest pain, palpitations, orthopnea and claudication.  Gastrointestinal:  Negative for abdominal pain, blood in stool, constipation, diarrhea, heartburn, melena, nausea and vomiting.  Genitourinary:  Negative for dysuria, flank pain, frequency, hematuria and urgency.  Musculoskeletal:  Negative for back pain, joint pain and myalgias.  Skin:  Negative for rash.  Neurological:  Negative for dizziness, tingling, focal weakness, seizures, weakness and headaches.  Endo/Heme/Allergies:  Does not bruise/bleed easily.  Psychiatric/Behavioral:  Negative for depression and suicidal ideas. The patient does not have insomnia.       Allergies  Allergen Reactions   Sulfa Antibiotics Rash     Past Medical History:  Diagnosis Date   Arthritis  Breast cancer (HCC)    Dysrhythmia    Palpitations, PVCs, PACs   Hypothyroidism    Overactive bladder    Skin cancer    Thyroid disease       Past Surgical History:  Procedure Laterality Date   ABDOMINAL HYSTERECTOMY  1973   APPENDECTOMY  1951   BREAST BIOPSY Left 07/20/2023   US  LT BREAST BX W LOC DEV 1ST LESION IMG BX SPEC US  GUIDE 07/20/2023 [facility]   BREAST BIOPSY  08/17/2023   MM LT RADIOACTIVE SEED LOC MAMMO GUIDE 08/17/2023 [facility]   BREAST EXCISIONAL BIOPSY Right    1960's 2 areas excised   BREAST LUMPECTOMY WITH RADIOACTIVE SEED LOCALIZATION Left 08/19/2023   Procedure: BREAST LUMPECTOMY WITH RADIOACTIVE SEED LOCALIZATION;  Surgeon: Ebbie Cough, MD;  Location: [facility] OR;  Service: General;  Laterality: Left;  LEFT BREAST SEED GUIDED LUMPECTOMY   CATARACT EXTRACTION W/ INTRAOCULAR LENS IMPLANT Bilateral    COLONOSCOPY      Social History   Socioeconomic History   Marital status: Married    Spouse name: Not on file   Number of children: Not on file   Years of education: Not on file   Highest education level: Not on file  Occupational History   Not on file  Tobacco Use   Smoking status: Never   Smokeless tobacco: Never  Vaping Use   Vaping status: Never Used  Substance and Sexual Activity   Alcohol use: No   Drug use: No   Sexual activity: Not Currently  Other Topics Concern   Not on file  Social History Narrative   Patient has a son that's 43 whom she's loves dearly!   Social Drivers of Corporate investment banker Strain: Low Risk  (03/30/2023)   Received from [facility]   Overall Financial Resource Strain (CARDIA)    Difficulty of Paying Living Expenses: Not hard at all  Food Insecurity: No Food Insecurity (07/27/2023)   Hunger Vital Sign    Worried About Running Out of Food in the Last Year: Never true    Ran Out of Food in the Last Year: Never true  Transportation Needs: No Transportation Needs (07/27/2023)   PRAPARE - Administrator, Civil Service (Medical): No    Lack of Transportation (Non-Medical): No  Physical Activity: Not on file   Stress: No Stress Concern Present (08/23/2023)   [employer] of Occupational Health - Occupational Stress Questionnaire    Feeling of Stress : Not at all  Social Connections: Not on file  Intimate Partner Violence: Not At Risk (07/27/2023)   Humiliation, Afraid, Rape, and Kick questionnaire    Fear of Current or Ex-Partner: No    Emotionally Abused: No    Physically Abused: No    Sexually Abused: No    Family History  Problem Relation Age of Onset   Congestive Heart Failure Mother    Heart Problems Father    COPD Sister    Lung cancer Brother    Heart Problems Brother    Breast cancer Maternal Aunt        dx 73s   Colon cancer Maternal Aunt        dx 70s   Ovarian cancer Maternal Aunt    Throat cancer Maternal Uncle    Breast cancer Paternal Aunt        dx 5s     Current Outpatient Medications:    acetaminophen  (TYLENOL ) 500 MG tablet, Take 500-1,000 mg by  mouth every 6 (six) hours as needed (pain.)., Disp: , Rfl:    anastrozole  (ARIMIDEX ) 1 MG tablet, TAKE 1 TABLET BY MOUTH DAILY, Disp: 90 tablet, Rfl: 3   Apoaequorin (PREVAGEN PO), Take 1 tablet by mouth in the morning., Disp: , Rfl:    Ascorbic Acid (VITAMIN C PO), Take 1 tablet by mouth every evening., Disp: , Rfl:    aspirin EC 81 MG tablet, Take 81 mg by mouth daily. Swallow whole., Disp: , Rfl:    atorvastatin (LIPITOR) 10 MG tablet, Take 10 mg by mouth daily at 6 PM., Disp: , Rfl:    B Complex-C (B-COMPLEX WITH VITAMIN C) tablet, Take 1 tablet by mouth daily with lunch., Disp: , Rfl:    BIOTIN PO, Take 1 tablet by mouth in the morning., Disp: , Rfl:    CALCIUM PO, Take 1 tablet by mouth every evening., Disp: , Rfl:    Cholecalciferol (VITAMIN D3 PO), Take 5,000 Units by mouth in the morning., Disp: , Rfl:    Coenzyme Q10 (COQ10 PO), Take 1 capsule by mouth daily with lunch., Disp: , Rfl:    Cranberry 400 MG TABS, Take 400 mg by mouth daily with lunch., Disp: , Rfl:    denosumab (PROLIA) 60 MG/ML SOSY  injection, Inject 60 mg into the skin every 6 (six) months., Disp: , Rfl:    Docusate Calcium (STOOL SOFTENER PO), Take 1 capsule by mouth at bedtime., Disp: , Rfl:    ELDERBERRY PO, Take 2 each by mouth in the morning., Disp: , Rfl:    levothyroxine (SYNTHROID) 25 MCG tablet, Take 25 mcg by mouth every evening., Disp: , Rfl:    MAGNESIUM PO, Take 1 capsule by mouth in the morning and at bedtime., Disp: , Rfl:    metoprolol succinate (TOPROL-XL) 50 MG 24 hr tablet, Take 50 mg by mouth 2 (two) times daily., Disp: , Rfl:    Multiple Vitamins-Minerals (ADULT ONE DAILY GUMMIES PO), Take 2 tablets by mouth in the morning. Women's Multivitamin, Disp: , Rfl:    Omega-3 Fatty Acids (OMEGA 3 PO), Take 1 capsule by mouth daily with lunch. Super Omega 3, Disp: , Rfl:    omeprazole (PRILOSEC) 20 MG capsule, Take 20 mg by mouth daily before breakfast., Disp: , Rfl:    OVER THE COUNTER MEDICATION, Take 2 capsules by mouth daily with lunch. [facility] MD-Bare Feet, Disp: , Rfl:    Probiotic Product (PROBIOTIC PO), Take 1 capsule by mouth in the morning., Disp: , Rfl:    TURMERIC PO, Take 900 mg by mouth in the morning and at bedtime., Disp: , Rfl:    Vibegron  75 MG TABS, Take 75 mg by mouth daily., Disp: 90 tablet, Rfl: 3   zinc sulfate, 50mg  elemental zinc, 220 (50 Zn) MG capsule, Take 220 mg by mouth in the morning., Disp: , Rfl:   Physical exam:  Vitals:   12/28/23 1033  BP: 122/64  Pulse: 78  Resp: 16  Temp: 98.3 F (36.8 C)  TempSrc: Tympanic  SpO2: 100%  Weight: 129 lb (58.5 kg)   Physical Exam Cardiovascular:     Rate and Rhythm: Normal rate and regular rhythm.     Heart sounds: Normal heart sounds.  Pulmonary:     Effort: Pulmonary effort is normal.     Breath sounds: Normal breath sounds.  Skin:    General: Skin is warm and dry.  Neurological:     Mental Status: She is alert and oriented to person,  place, and time.    Breast exam was performed in seated and lying down  position. Patient is status post left lumpectomy with a well-healed surgical scar. No evidence of any palpable masses. No evidence of axillary adenopathy. No evidence of any palpable masses or lumps in the right breast. No evidence of right axillary adenopathy   I have personally reviewed labs listed below:    Latest Ref Rng & Units 12/28/2023   10:02 AM  CMP  Glucose 70 - 99 mg/dL 890   BUN 8 - 23 mg/dL 15   Creatinine 9.55 - 1.00 mg/dL 9.29   Sodium 864 - 854 mmol/L 135   Potassium 3.5 - 5.1 mmol/L 4.1   Chloride 98 - 111 mmol/L 103   CO2 22 - 32 mmol/L 25   Calcium 8.9 - 10.3 mg/dL 9.0   Total Protein 6.5 - 8.1 g/dL 7.2   Total Bilirubin 0.0 - 1.2 mg/dL 0.7   Alkaline Phos 38 - 126 U/L 63   AST 15 - 41 U/L 29   ALT 0 - 44 U/L 19       Latest Ref Rng & Units 08/17/2023   10:40 AM  CBC  WBC 4.0 - 10.5 K/uL 6.3   Hemoglobin 12.0 - 15.0 g/dL 86.5   Hematocrit 63.9 - 46.0 % 41.2   Platelets 150 - 400 K/uL 219      Assessment and plan- Patient is a 83 y.o. female with history of stage Ia ER/PR positive HER2 negative left breast cancer status postlumpectomy and presently on Arimidex  here for a routine follow-up  Clinically patient is doing well with no concerning signs and symptoms of recurrence based on today's exam.She is tolerating Arimidex  along with calcium and vitamin D  well I will see her back in 4 months.  She will be due for a mammogram in March 2026.  She had a baseline bone density scan in April 2025 which showed osteopenia with improvement in her bone density as compared to 2023.  She has been on Prolia for over 5 years now and will be discussing with Dr. Alla to come off bisphosphonates   Visit Diagnosis 1. Encounter for follow-up surveillance of breast cancer   2. Visit for monitoring Arimidex  therapy   3. High risk medication use      Dr. Annah Skene, MD, MPH [facility] [phone] 12/28/2023 1:05 PM

## 2023-12-28 NOTE — Progress Notes (Signed)
Pt in for follow up, denies any concerns today. 

## 2023-12-29 NOTE — Telephone Encounter (Signed)
-----   Message from Annah JAYSON Skene sent at 12/28/2023  3:47 PM EDT ----- Please have her cut back on vit d to 1000 international units from 2000  ----- Message ----- From: Rebecka, Lab [facility] [place] Sent: 12/28/2023  10:34 AM EDT To: Annah JAYSON Skene, MD

## 2023-12-29 NOTE — Telephone Encounter (Signed)
 Per Dr. Melanee Please have her cut back on vit d to 1000 international units from 2000.  Outbound call; spoke to patient and informed of above.  Patient said she saw labs yesterday evening and verbalized understanding.

## 2024-04-10 ENCOUNTER — Ambulatory Visit (INDEPENDENT_AMBULATORY_CARE_PROVIDER_SITE_OTHER): Payer: Self-pay | Admitting: Urology

## 2024-04-10 VITALS — BP 137/83 | HR 88 | Ht 63.0 in | Wt 122.5 lb

## 2024-04-10 DIAGNOSIS — R350 Frequency of micturition: Secondary | ICD-10-CM

## 2024-04-10 LAB — URINALYSIS, COMPLETE
Bilirubin, UA: NEGATIVE
Glucose, UA: NEGATIVE
Ketones, UA: NEGATIVE
Leukocytes,UA: NEGATIVE
Nitrite, UA: NEGATIVE
Protein,UA: NEGATIVE
Specific Gravity, UA: <1.005 — ABNORMAL LOW (ref 1.005–1.030)
Urobilinogen, Ur: 0.2 mg/dL (ref 0.2–1.0)
pH, UA: 6 (ref 5.0–7.5)

## 2024-04-10 LAB — MICROSCOPIC EXAMINATION

## 2024-04-10 NOTE — Progress Notes (Signed)
 "  04/10/2024 1:43 PM   Erica Donaldson [birth year] [id number]  Referring provider: Alla Amis, MD [phone] [facility] [address] [facility] [place],  [place] [address]  No chief complaint on file.   HPI: I reviewed my lengthy note.  Urge incontinence excellent control and only takes Gemtesa  sometimes only once a month or as needed.  Clinically not infected and no issues with potential erosion of bulking agent.  Using estrogen cream 3 times a week and having little to no vaginal burning.  Patient had a negative CT scan December 30, 2022 for hematuria workup   On pelvic examination vaginal epithelium looked healthy.  No tenderness.  No prolapse or stress incontinence.  No urethral issues   Cystoscopy: Patient underwent flexible cystoscopy.  Bladder mucosa and trigone normal.  I could see at 4:00 1 area where the urothelium had broken down and I could see a little bit of the bulking agent.  No stone that was obvious.  It was very benign looking and minimal.  Urethra itself looked nice and healthy.    Continue to take Gemtesa  as needed.  Continues estrogen cream 2-3 times a week.  Watchful waiting for any bulking agent erosion.  See in 1 year estrogen renewed.  Picture drawn.  Risk of forming a stone that was clinically relevant discussed.  Call if culture positive   April 10, 2024 Frequency stable.  Last culture negative Takes Gemtesa  3 tablets a month and does well.  Did not need a prescription.  Clinically not infected.  Does not use the estrogen cream anymore since she had a lumpectomy.  No gross hematuria or dysuria  PMH: Past Medical History:  Diagnosis Date   Arthritis    Breast cancer (HCC)    Dysrhythmia    Palpitations, PVCs, PACs   Hypothyroidism    Overactive bladder    Skin cancer    Thyroid disease     Surgical History: Past Surgical History:  Procedure Laterality Date   ABDOMINAL HYSTERECTOMY  1973   APPENDECTOMY  1951   BREAST BIOPSY Left 07/20/2023    US  LT BREAST BX W LOC DEV 1ST LESION IMG BX SPEC US  GUIDE 07/20/2023 [facility]   BREAST BIOPSY  08/17/2023   MM LT RADIOACTIVE SEED LOC MAMMO GUIDE 08/17/2023 [facility]   BREAST EXCISIONAL BIOPSY Right    1960's 2 areas excised   BREAST LUMPECTOMY WITH RADIOACTIVE SEED LOCALIZATION Left 08/19/2023   Procedure: BREAST LUMPECTOMY WITH RADIOACTIVE SEED LOCALIZATION;  Surgeon: Ebbie Cough, MD;  Location: [facility] OR;  Service: General;  Laterality: Left;  LEFT BREAST SEED GUIDED LUMPECTOMY   CATARACT EXTRACTION W/ INTRAOCULAR LENS IMPLANT Bilateral    COLONOSCOPY      Home Medications:  Allergies as of 04/10/2024       Reactions   Sulfa Antibiotics Rash        Medication List        Accurate as of April 10, 2024  1:43 PM. If you have any questions, ask your nurse or doctor.          acetaminophen  500 MG tablet Commonly known as: TYLENOL  Take 500-1,000 mg by mouth every 6 (six) hours as needed (pain.).   ADULT ONE DAILY GUMMIES PO Take 2 tablets by mouth in the morning. Women's Multivitamin   anastrozole  1 MG tablet Commonly known as: ARIMIDEX  TAKE 1 TABLET BY MOUTH DAILY   aspirin EC 81 MG tablet Take 81 mg by mouth daily. Swallow whole.   atorvastatin 10  MG tablet Commonly known as: LIPITOR Take 10 mg by mouth daily at 6 PM.   B-complex with vitamin C tablet Take 1 tablet by mouth daily with lunch.   BIOTIN PO Take 1 tablet by mouth in the morning.   CALCIUM PO Take 1 tablet by mouth every evening.   COQ10 PO Take 1 capsule by mouth daily with lunch.   Cranberry 400 MG Tabs Take 400 mg by mouth daily with lunch.   denosumab 60 MG/ML Sosy injection Commonly known as: PROLIA Inject 60 mg into the skin every 6 (six) months.   ELDERBERRY PO Take 2 each by mouth in the morning.   levothyroxine 25 MCG tablet Commonly known as: SYNTHROID Take 25 mcg by mouth every evening.   MAGNESIUM PO Take 1 capsule by mouth in the morning and at  bedtime.   metoprolol succinate 50 MG 24 hr tablet Commonly known as: TOPROL-XL Take 50 mg by mouth 2 (two) times daily.   OMEGA 3 PO Take 1 capsule by mouth daily with lunch. Super Omega 3   omeprazole 20 MG capsule Commonly known as: PRILOSEC Take 20 mg by mouth daily before breakfast.   OVER THE COUNTER MEDICATION Take 2 capsules by mouth daily with lunch. [facility] MD-Bare Feet   PREVAGEN PO Take 1 tablet by mouth in the morning.   PROBIOTIC PO Take 1 capsule by mouth in the morning.   STOOL SOFTENER PO Take 1 capsule by mouth at bedtime.   TURMERIC PO Take 900 mg by mouth in the morning and at bedtime.   Vibegron  75 MG Tabs Take 75 mg by mouth daily.   VITAMIN C PO Take 1 tablet by mouth every evening.   VITAMIN D3 PO Take 5,000 Units by mouth in the morning.   zinc sulfate (50mg  elemental zinc) 220 (50 Zn) MG capsule Take 220 mg by mouth in the morning.        Allergies: Allergies[1]  Family History: Family History  Problem Relation Age of Onset   Congestive Heart Failure Mother    Heart Problems Father    COPD Sister    Lung cancer Brother    Heart Problems Brother    Breast cancer Maternal Aunt        dx 88s   Colon cancer Maternal Aunt        dx 90s   Ovarian cancer Maternal Aunt    Throat cancer Maternal Uncle    Breast cancer Paternal Aunt        dx 26s    Social History:  reports that she has never smoked. She has never used smokeless tobacco. She reports that she does not drink alcohol and does not use drugs.  ROS:                                        Physical Exam: There were no vitals taken for this visit.  Constitutional:  Alert and oriented, No acute distress. HEENT: [place] AT, moist mucus membranes.  Trachea midline, no masses.   Laboratory Data: Lab Results  Component Value Date   WBC 6.3 08/17/2023   HGB 13.4 08/17/2023   HCT 41.2 08/17/2023   MCV 97.2 08/17/2023   PLT 219 08/17/2023     Lab Results  Component Value Date   CREATININE 0.70 12/28/2023    No results found for: PSA  No results found  for: TESTOSTERONE  No results found for: HGBA1C  Urinalysis    Component Value Date/Time   COLORURINE YELLOW (A) 05/22/2017 0956   APPEARANCEUR Clear 04/12/2023 1405   LABSPEC 1.019 05/22/2017 0956   PHURINE 6.0 05/22/2017 0956   GLUCOSEU Negative 04/12/2023 1405   HGBUR NEGATIVE 05/22/2017 0956   BILIRUBINUR Negative 04/12/2023 1405   KETONESUR NEGATIVE 05/22/2017 0956   PROTEINUR Negative 04/12/2023 1405   PROTEINUR NEGATIVE 05/22/2017 0956   NITRITE Negative 04/12/2023 1405   NITRITE NEGATIVE 05/22/2017 0956   LEUKOCYTESUR Negative 04/12/2023 1405    Pertinent Imaging:   Assessment & Plan: Reassess in 1 year  There are no diagnoses linked to this encounter.  No follow-ups on file.  Glendia DELENA Elizabeth, MD  [facility] [address] [place], [place] [address] [phone]     [1]  Allergies Allergen Reactions   Sulfa Antibiotics Rash   "

## 2024-04-13 LAB — CULTURE, URINE COMPREHENSIVE

## 2024-04-19 NOTE — Progress Notes (Signed)
 CC: Preventative Health Exam  HPI  Erica Donaldson is a 84 y.o. here for preventative health exam and subsequent medicare wellness  Preventative health exam: No acute issues.  Chronic medical issues stable and tolerating medications without adverse effects.  Not missing doses of levothyroxine.  Exercises regularly and tries to adhere to healthy diet.  No exertional cp or syncopal episodes.  No vaginal symptoms, urinary issues, or rectal pain/bleeding.  Denies any tobacco use.    ROS Review of systems is unremarkable for any active cardiac, respiratory, GI, GU, hematologic, neurologic, dermatologic, HEENT, or psychiatric symptoms except as noted above.  No fevers, chills, or constitutional symptoms.   Current Outpatient Medications  Medication Sig Dispense Refill   acetaminophen  (TYLENOL ) 500 MG tablet Take 1,000 mg by mouth as needed for Pain     anastrozole  (ARIMIDEX ) 1 mg tablet Take 1 mg by mouth once daily     aspirin 81 MG EC tablet Take 81 mg by mouth once daily     atorvastatin (LIPITOR) 10 MG tablet TAKE 1 TABLET BY MOUTH ONCE  DAILY 90 tablet 3   calcium carbonate (CALCIUM 500 ORAL) Take 1,200 mg by mouth     cholecalciferol, vitamin D3, (VITAMIN D3) 125 mcg (5,000 unit) tablet Take 1 tablet by mouth once daily     cranberry fruit extract (CRANBERRY CONCENTRATE ORAL) Take by mouth once daily     cycloSPORINE (RESTASIS) 0.05 % ophthalmic emulsion Place 1 drop into both eyes 2 (two) times daily     denosumab (PROLIA) 60 mg/mL inj syringe Inject subcutaneously once Every 6 months     famotidine (PEPCID) 20 MG tablet TAKE 1 TABLET BY MOUTH TWICE  DAILY AS NEEDED FOR HEARTBURN 200 tablet 1   Herbal Supplement Hair, Skin, and Nails (Biotin 5000mg )- 2 tablet by mouth once daily  Zinc Vitamin B Complex     levothyroxine (SYNTHROID) 50 MCG tablet Take 1 tablet (50 mcg total) by mouth every morning before breakfast (0630) ON AN EMPTY STOMACH WITH A GLASS OF WATER AT LEAST 30 TO  60 MINUTES BEFORE BREAKFAST 100 tablet 1   metoprolol SUCCinate (TOPROL-XL) 50 MG XL tablet TAKE 1 TABLET BY MOUTH TWICE  DAILY 180 tablet 3   multivitamin tablet Take 1 tablet by mouth once daily     omega-3 fatty acids/fish oil (FISH OIL EXTRA STRENGTH ORAL) Take by mouth once daily     omeprazole (PRILOSEC) 20 MG DR capsule TAKE 1 CAPSULE BY MOUTH ONCE  DAILY 90 capsule 3   UNABLE TO FIND Med Name: collagen powder 10 g daily     vibegron  75 mg Tab Take 75 mg by mouth once daily as needed 3 pills monthly     No current facility-administered medications for this visit.    Allergies as of 04/19/2024 - Reviewed 04/19/2024  Allergen Reaction Noted   Sulfa (sulfonamide antibiotics) Rash 05/25/2017    Patient Active Problem List  Diagnosis   Intermittent palpitations on Toprol - followed by Dr. Florencio   Pure hypercholesterolemia (LDL 80 - 04/03/24)   Acquired hypothyroidism (TSH 4.7 - 04/03/24)   Gastroesophageal reflux disease without esophagitis   Medicare annual wellness visit, initial 02/24/18   Localized osteoporosis without current pathological fracture (Dexa 05/27/17)   Medicare annual wellness visit, subsequent 04/19/24   Detrusor instability of bladder   Dyspareunia due to medical condition in female   History of hysterectomy   Urinary, incontinence, stress female   Vaginal atrophy   Malignant neoplasm of upper-outer  quadrant of left breast in female, estrogen receptor positive (2025) - followed by Dr. Melanee    Past Medical History:  Diagnosis Date   Arthritis    History of cancer    Hypertension    Thyroid disease     Past Surgical History:  Procedure Laterality Date   left breast lumpectomy Left 08/19/2023   APPENDECTOMY     Benign right breast lumpectomy     HYSTERECTOMY      Family History  Problem Relation Name Age of Onset   Heart disease Mother     Myocardial Infarction (Heart attack) Father     Heart disease Father      Lung cancer Brother     Coronary Artery Disease (Blocked arteries around heart) Brother      Social History   Socioeconomic History   Marital status: Married  Tobacco Use   Smoking status: Never    Passive exposure: Past   Smokeless tobacco: Never  Vaping Use   Vaping status: Never Used  Substance and Sexual Activity   Alcohol use: Never   Drug use: Never   Sexual activity: Not Currently    Partners: Male    Birth control/protection: Surgical  Social History Narrative   Marital Status- Married   Lives with husband   Employment- Self-employed   Exercise hx- Walks 1 hour daily   Religious Affiliation- [employer]   Social Drivers of Health   Financial Resource Strain: Low Risk  (04/19/2024)   Overall Financial Resource Strain (CARDIA)    Difficulty of Paying Living Expenses: Not hard at all  Food Insecurity: No Food Insecurity (04/19/2024)   Hunger Vital Sign    Worried About Running Out of Food in the Last Year: Never true    Ran Out of Food in the Last Year: Never true  Transportation Needs: No Transportation Needs (04/19/2024)   PRAPARE - Transportation    Lack of Transportation (Medical): No    Lack of Transportation (Non-Medical): No  Stress: No Stress Concern Present (08/23/2023)   Received from [facility] - Occupational Stress Questionnaire    Feeling of Stress : Not at all  Housing Stability: Low Risk  (04/19/2024)   Housing Stability Vital Sign    Unable to Pay for Housing in the Last Year: No    Number of Times Moved in the Last Year: 0    Homeless in the Last Year: No    Health Maintenance  Topic Date Due   RSV Immunization Pregnant or 50+ (1 - 75-dose 75+ series) Never done   Medicare Subsequent AWV G0439  03/30/2024   Annual Physical/Well Child Check  03/30/2024   TSH Level  04/03/2025   Depression Screening  04/19/2025   DXA Bone Density Scan  07/26/2028   Lipid Panel  04/03/2029   Medicare  Initial AWV [id number]  Completed   Shingrix  Completed   Hib Vaccines  Aged Out   Hepatitis A Vaccines  Aged Out   Meningococcal B Vaccine  Aged Out   Meningococcal ACWY Vaccine  Aged Out   HPV Vaccines  Aged Out   COVID-19 Vaccine  Discontinued   Pneumococcal Vaccine: 50+  Discontinued   Adult Tetanus (Td And Tdap)  Discontinued   Colorectal Cancer Screening  Discontinued   Influenza Vaccine  Discontinued    Vitals:   04/19/24 1157  BP: 134/68  Pulse: 78  SpO2: 98%  Weight: 57.5 kg (126 lb 12.8 oz)  Height:  160 cm (5' 3)  PainSc:   2  PainLoc: Knee   Body mass index is 22.46 kg/m.  Exam  General. Well appearing; NAD; VS reviewed     Eyes. Sclera and conjunctiva clear; Vision grossly intact; extraocular movements intact Neck. Supple. No swelling, masses, thyroid normal size, no masses palpated.   Lungs. Respirations unlabored; clear to auscultation bilaterally Back. No spinal deformity; no tenderness to palpation of spinous processes Cardiovascular. Heart regular rate and rhythm without murmurs, gallops, or rubs Abdomen. Soft; non tender; non distended; normoactive bowel sounds; no masses or organomegaly Lymph Nodes. No significant cervical or supraclavicular lymphadenopathy noted Musculoskeletal. No deformities; no active joint inflammation Extremities.  no edema Skin. Normal color and turgor Pulses. Dorsalis pedis palpable and symmetric bilaterally Neurologic. Alert and oriented x3; CN 2-12 grossly intact; no focal deficits  Assessment and Plan  1. Preventative health exam- Stable exam.  CV screening labs reviewed with patient.  No hypertension, hyperlipidemia, or diabetes.  CBC wnls.  Aged out of further mammogram, pap smear, and colonoscopy.  Vaccinations reviewed.  Counseled on nutrition modification and exercise.  2. Subsequent medicare wellness Providers Rendering Care 1. Dr. Alda Carpen (PCP) 2. Dr. Cara Lovelace [facility] Cardiology) 3. [facility]  Endocrinology 4. Dr. Gaston (urology)   Functional Assessment (1) Hearing: Has hearing aides (2) Risk of Falls: Patient denies any falls or near falls in the last year, Gait steady without assistance during walk from waiting area to exam room (3) Home Safety: Patient feels secure in their home, There are operational smoke alarms in multiple areas of the home (4) Activities of Daily Living: Independently manages personal grooming and household chores, including cooking, cleaning and laundry. Manages Personal finances without assistance.   PHQ 2/9 last 3 flowsheet values     03/17/2022 03/30/2023 04/19/2024  PHQ-9 Depression Screening   Little interest or pleasure in doing things  0 0  Feeling down, depressed, or hopeless  0 0  (OBSOLETE) Little interest or pleasure in doing things 0    (OBSOLETE) Feeling down, depressed, or hopeless (or irritable for Teens only)? 0    (OBSOLETE) Total Score = 0        Depression Severity and Treatment Recommendations:  0-4= None  5-9= Mild / Treatment: Support, educate to call if worse; return in one month  10-14= Moderate / Treatment: Support, watchful waiting; Antidepressant or Psychotherapy  15-19= Moderately severe / Treatment: Antidepressant OR Psychotherapy  >= 20 = Major depression, severe / Antidepressant AND Psychotherapy   Cognitive Impairment Patient denies episodes of loosing things, being forgetful. Seems oriented to person, place and time.  Responses appear appropriate and timely to this observer.   PREVENTION PLAN   Cardiovascular: FLP q6 months Diabetes: FBG annually Glaucoma: Neg per pt Hepatitis B (HBV) Vaccine:  Not Applicable Smoking Cessation:  Not Applicable   Other Personalized Health Advice   Encouraged patient to exercise 5 days a week, walking, water aerobics, gentle stretching recommended. Increase dietary intake of fresh fruits and vegetables, reduce red meat to twice a week.   End of Life Counseling Patient  has living will in place; POA - Elna Dross (son); DNR/DNI   Medications and allergies reviewed and reconciled.  Preventative health and labs reviewed w/ pt.  3.  Hypothyroidism Not at goal.  TSH 4.7.  Target TSH between 1 and 2.  Plan to increase levothyroxine to 50 mcg and reassess TSH and free T4 in 8 weeks.   Goals Addressed  This Visit's Progress    * Lose Weight (pt-stated)   On track    * Maintain health/healthy lifestyle (pt-stated)   On track      Follow up: 6 months for reck; labs prior; TSh/T4 in 8 wks  ALDA CARPEN, MD *Some images could not be shown.

## 2024-05-02 ENCOUNTER — Other Ambulatory Visit: Payer: Self-pay | Admitting: Oncology

## 2024-05-02 ENCOUNTER — Encounter: Payer: Self-pay | Admitting: Oncology

## 2024-05-02 ENCOUNTER — Inpatient Hospital Stay: Attending: Oncology | Admitting: Oncology

## 2024-05-02 VITALS — BP 118/78 | HR 88 | Temp 96.7°F | Resp 19 | Ht 63.0 in | Wt 125.5 lb

## 2024-05-02 DIAGNOSIS — Z79899 Other long term (current) drug therapy: Secondary | ICD-10-CM

## 2024-05-02 DIAGNOSIS — Z1721 Progesterone receptor positive status: Secondary | ICD-10-CM | POA: Diagnosis not present

## 2024-05-02 DIAGNOSIS — Z8041 Family history of malignant neoplasm of ovary: Secondary | ICD-10-CM

## 2024-05-02 DIAGNOSIS — C50412 Malignant neoplasm of upper-outer quadrant of left female breast: Secondary | ICD-10-CM

## 2024-05-02 DIAGNOSIS — Z5181 Encounter for therapeutic drug level monitoring: Secondary | ICD-10-CM

## 2024-05-02 DIAGNOSIS — Z79811 Long term (current) use of aromatase inhibitors: Secondary | ICD-10-CM | POA: Diagnosis not present

## 2024-05-02 DIAGNOSIS — Z170 Estrogen receptor positive status [ER+]: Secondary | ICD-10-CM | POA: Diagnosis not present

## 2024-05-02 DIAGNOSIS — Z1732 Human epidermal growth factor receptor 2 negative status: Secondary | ICD-10-CM

## 2024-05-02 DIAGNOSIS — Z803 Family history of malignant neoplasm of breast: Secondary | ICD-10-CM | POA: Diagnosis not present

## 2024-05-02 DIAGNOSIS — Z801 Family history of malignant neoplasm of trachea, bronchus and lung: Secondary | ICD-10-CM

## 2024-05-02 DIAGNOSIS — Z808 Family history of malignant neoplasm of other organs or systems: Secondary | ICD-10-CM

## 2024-05-02 DIAGNOSIS — Z800 Family history of malignant neoplasm of digestive organs: Secondary | ICD-10-CM | POA: Diagnosis not present

## 2024-05-02 NOTE — Progress Notes (Signed)
 "    Hematology/Oncology Consult note [facility]  [phone] [phone]  Patient Care Team: Alla Amis, MD as PCP - General (Family Medicine) Georgina Shasta POUR, RN as Oncology Nurse Navigator Melanee Annah BROCKS, MD as Consulting Physician (Oncology) Lenn Aran, MD as Consulting Physician (Radiation Oncology)   Name of the patient: Erica Donaldson  [id number]  May 07, 1940   Date of visit: 05/02/24  Diagnosis-  Cancer Staging  Malignant neoplasm of upper-outer quadrant of left breast in female, estrogen receptor positive (HCC) Staging form: Breast, AJCC 8th Edition - Clinical stage from 07/27/2023: Stage IA (cT1a, cN0, cM0, G2, ER+, PR+, HER2-) - Signed by Melanee Annah BROCKS, MD on 07/27/2023 Histologic grading system: 3 grade system - Pathologic stage from 09/15/2023: Stage IA (pT1b, pN0, cM0, G2, ER+, PR+, HER2-, Oncotype DX score: 8) - Signed by Melanee Annah BROCKS, MD on 09/15/2023 Multigene prognostic tests performed: Oncotype DX Recurrence score range: Less than 11 Histologic grading system: 3 grade system '  Chief complaint/ Reason for visit-routine follow-up visit for breast cancer  Heme/Onc history: patient is a 84 year old female with a past medical history Significant for hypothyroidism and overactive bladder.  She underwent a screening mammogram in March 2025 which showed a possible mass in the left breast.  This was followed by diagnostic mammogram and an ultrasound which showed irregular hypoechoic mass at the 1 o'clock position 2 cm from the nipple measuring 5 x 4 x 2 mm.  No left axillary adenopathy.  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   Final left lumpectomy pathologyShe from 08/19/2023 showed a 0.6 cm grade 2 invasive mammary carcinoma with negative margins.  Sentinel lymph node  biopsy not performed given her age.  Oncotype testing came back at 8 and therefore she would not benefit from adjuvant chemotherapy.  She has met with radiation oncology and will decide if she wants to pursue radiation or not.  Baseline bone density scan showed osteopenia which was improved as compared to previous osteoporosis.  She gets Prolia through her primary care Dr. Alla.  Patient started taking Arimidex  in June 2025.  She decided not to proceed with adjuvant radiation therapy  Interval history-  Erica Donaldson is an 84 year old female with hormone receptor-positive breast cancer who presents for oncology follow-up.  She underwent breast cancer surgery in April of last year and has been on adjuvant Arimidex  therapy for approximately one year. She tolerates Arimidex  well, reporting only mild, occasional hot flashes. She denies new oncologic symptoms.  She has osteoporosis and has been receiving Prolia therapy long-term. Under the direction of her other physician, she is transitioning off Prolia and is scheduled for a Zemaira infusion on February 18th.  She has longstanding hypothyroidism managed with thyroid replacement therapy. She recently noted an elevation in thyroid levels for the first time in ten years and discussed this with her endocrinologist, who did not think it was related to her oncologic medications.       ECOG PS- 1 Pain scale- 0   Review of systems- Review of Systems  Constitutional:  Negative for chills, fever, malaise/fatigue and weight loss.  HENT:  Negative for congestion, ear discharge and nosebleeds.   Eyes:  Negative for blurred vision.  Respiratory:  Negative for cough, hemoptysis, sputum production, shortness of breath and wheezing.   Cardiovascular:  Negative for chest  pain, palpitations, orthopnea and claudication.  Gastrointestinal:  Negative for abdominal pain, blood in stool, constipation, diarrhea, heartburn, melena, nausea and vomiting.   Genitourinary:  Negative for dysuria, flank pain, frequency, hematuria and urgency.  Musculoskeletal:  Negative for back pain, joint pain and myalgias.  Skin:  Negative for rash.  Neurological:  Negative for dizziness, tingling, focal weakness, seizures, weakness and headaches.  Endo/Heme/Allergies:  Does not bruise/bleed easily.  Psychiatric/Behavioral:  Negative for depression and suicidal ideas. The patient does not have insomnia.       Allergies[1]   Past Medical History:  Diagnosis Date   Arthritis    Breast cancer (HCC)    Dysrhythmia    Palpitations, PVCs, PACs   Hypothyroidism    Overactive bladder    Skin cancer    Thyroid disease      Past Surgical History:  Procedure Laterality Date   ABDOMINAL HYSTERECTOMY  1973   APPENDECTOMY  1951   BREAST BIOPSY Left 07/20/2023   US  LT BREAST BX W LOC DEV 1ST LESION IMG BX SPEC US  GUIDE 07/20/2023 [facility]   BREAST BIOPSY  08/17/2023   MM LT RADIOACTIVE SEED LOC MAMMO GUIDE 08/17/2023 [facility]   BREAST EXCISIONAL BIOPSY Right    1960's 2 areas excised   BREAST LUMPECTOMY WITH RADIOACTIVE SEED LOCALIZATION Left 08/19/2023   Procedure: BREAST LUMPECTOMY WITH RADIOACTIVE SEED LOCALIZATION;  Surgeon: Ebbie Cough, MD;  Location: [facility] OR;  Service: General;  Laterality: Left;  LEFT BREAST SEED GUIDED LUMPECTOMY   CATARACT EXTRACTION W/ INTRAOCULAR LENS IMPLANT Bilateral    COLONOSCOPY      Social History   Socioeconomic History   Marital status: Married    Spouse name: Not on file   Number of children: Not on file   Years of education: Not on file   Highest education level: Not on file  Occupational History   Not on file  Tobacco Use   Smoking status: Never   Smokeless tobacco: Never  Vaping Use   Vaping status: Never Used  Substance and Sexual Activity   Alcohol use: No   Drug use: No   Sexual activity: Not Currently  Other Topics Concern   Not on file  Social History Narrative   Patient has a  son that's 14 whom she's loves dearly!   Social Drivers of Health   Tobacco Use: Low Risk (05/02/2024)   Patient History    Smoking Tobacco Use: Never    Smokeless Tobacco Use: Never    Passive Exposure: Not on file  Financial Resource Strain: Low Risk  (04/19/2024)   Received from [facility]   Overall Financial Resource Strain (CARDIA)    Difficulty of Paying Living Expenses: Not hard at all  Food Insecurity: No Food Insecurity (04/19/2024)   Received from [facility]   Epic    Within the past 12 months, you worried that your food would run out before you got the money to buy more.: Never true    Within the past 12 months, the food you bought just didn't last and you didn't have money to get more.: Never true  Transportation Needs: No Transportation Needs (04/19/2024)   Received from [facility] - Transportation    In the past 12 months, has lack of transportation kept you from medical appointments or from getting medications?: No    Lack of Transportation (Non-Medical): No  Physical Activity: Not on file  Stress: No Stress Concern Present (  08/23/2023)   [employer] of Occupational Health - Occupational Stress Questionnaire    Feeling of Stress : Not at all  Social Connections: Not on file  Intimate Partner Violence: Not At Risk (07/27/2023)   Humiliation, Afraid, Rape, and Kick questionnaire    Fear of Current or Ex-Partner: No    Emotionally Abused: No    Physically Abused: No    Sexually Abused: No  Depression (PHQ2-9): Low Risk (05/02/2024)   Depression (PHQ2-9)    PHQ-2 Score: 0  Alcohol Screen: Not on file  Housing: Low Risk  (04/19/2024)   Received from [facility]   Epic    In the last 12 months, was there a time when you were not able to pay the mortgage or rent on time?: No    In the past 12 months, how many times have you moved where you were living?: 0    At any time in the past 12  months, were you homeless or living in a shelter (including now)?: No  Utilities: Not At Risk (04/19/2024)   Received from [facility]   Epic    In the past 12 months has the electric, gas, oil, or water company threatened to shut off services in your home?: No  Health Literacy: Adequate Health Literacy (08/23/2023)   B1300 Health Literacy    Frequency of need for help with medical instructions: Never    Family History  Problem Relation Age of Onset   Congestive Heart Failure Mother    Heart Problems Father    COPD Sister    Lung cancer Brother    Heart Problems Brother    Breast cancer Maternal Aunt        dx 21s   Colon cancer Maternal Aunt        dx 70s   Ovarian cancer Maternal Aunt    Throat cancer Maternal Uncle    Breast cancer Paternal Aunt        dx 41s    Current Medications[2]  Physical exam:  Vitals:   05/02/24 1008  BP: 118/78  Pulse: 88  Resp: 19  Temp: (!) 96.7 F (35.9 C)  TempSrc: Tympanic  SpO2: 98%  Weight: 125 lb 8 oz (56.9 kg)  Height: 5' 3 (1.6 m)   Physical Exam Cardiovascular:     Rate and Rhythm: Normal rate and regular rhythm.     Heart sounds: Normal heart sounds.  Pulmonary:     Effort: Pulmonary effort is normal.     Breath sounds: Normal breath sounds.  Skin:    General: Skin is warm and dry.  Neurological:     Mental Status: She is alert and oriented to person, place, and time.    Breast exam was performed in seated and lying down position. Patient is status post left lumpectomy with a well-healed surgical scar. No evidence of any palpable masses. No evidence of axillary adenopathy. No evidence of any palpable masses or lumps in the right breast. No evidence of right axillary adenopathy   I have personally reviewed labs listed below:    Latest Ref Rng & Units 12/28/2023   10:02 AM  CMP  Glucose 70 - 99 mg/dL 890   BUN 8 - 23 mg/dL 15   Creatinine 9.55 - 1.00 mg/dL 9.29   Sodium 864 - 854 mmol/L 135    Potassium 3.5 - 5.1 mmol/L 4.1   Chloride 98 - 111 mmol/L 103   CO2 22 - 32  mmol/L 25   Calcium 8.9 - 10.3 mg/dL 9.0   Total Protein 6.5 - 8.1 g/dL 7.2   Total Bilirubin 0.0 - 1.2 mg/dL 0.7   Alkaline Phos 38 - 126 U/L 63   AST 15 - 41 U/L 29   ALT 0 - 44 U/L 19       Latest Ref Rng & Units 08/17/2023   10:40 AM  CBC  WBC 4.0 - 10.5 K/uL 6.3   Hemoglobin 12.0 - 15.0 g/dL 86.5   Hematocrit 63.9 - 46.0 % 41.2   Platelets 150 - 400 K/uL 219      Assessment and plan- Patient is a 84 y.o. female with history of stage I left breast cancer s/p lumpectomy and presently on Arimidex .  She declined adjuvant radiation therapy.  This is a routine follow-up visit  Assessment and Plan    Hormone receptor-positive breast cancer, status post surgery, on adjuvant endocrine therapy One year post-surgery, on anastrozole  with mild vasomotor symptoms. No significant adverse effects. - Clinically she is doing well with no concerning signs and symptoms of recurrence based on today's exam - Continue anastrozole  therapy. - Schedule surveillance mammogram for March.  Osteoporosis - She follows up with endocrinology for the same.        Visit Diagnosis 1. Malignant neoplasm of upper-outer quadrant of left breast in female, estrogen receptor positive (HCC)   2. Visit for monitoring Arimidex  therapy   3. High risk medication use      Dr. Annah Skene, MD, MPH [facility] [phone] 05/02/2024 1:13 PM                   [1]  Allergies Allergen Reactions   Sulfa Antibiotics Rash  [2]  Current Outpatient Medications:    acetaminophen  (TYLENOL ) 500 MG tablet, Take 500-1,000 mg by mouth every 6 (six) hours as needed (pain.)., Disp: , Rfl:    anastrozole  (ARIMIDEX ) 1 MG tablet, TAKE 1 TABLET BY MOUTH DAILY, Disp: 90 tablet, Rfl: 3   Apoaequorin (PREVAGEN PO), Take 1 tablet by mouth in the morning., Disp: , Rfl:    Ascorbic Acid (VITAMIN C PO), Take 1  tablet by mouth every evening., Disp: , Rfl:    aspirin EC 81 MG tablet, Take 81 mg by mouth daily. Swallow whole., Disp: , Rfl:    atorvastatin (LIPITOR) 10 MG tablet, Take 10 mg by mouth daily at 6 PM., Disp: , Rfl:    B Complex-C (B-COMPLEX WITH VITAMIN C) tablet, Take 1 tablet by mouth daily with lunch., Disp: , Rfl:    BIOTIN PO, Take 1 tablet by mouth in the morning., Disp: , Rfl:    CALCIUM PO, Take 1 tablet by mouth every evening., Disp: , Rfl:    Cholecalciferol (VITAMIN D3 PO), Take 5,000 Units by mouth in the morning., Disp: , Rfl:    Coenzyme Q10 (COQ10 PO), Take 1 capsule by mouth daily with lunch., Disp: , Rfl:    Cranberry 400 MG TABS, Take 400 mg by mouth daily with lunch., Disp: , Rfl:    denosumab (PROLIA) 60 MG/ML SOSY injection, Inject 60 mg into the skin every 6 (six) months., Disp: , Rfl:    Docusate Calcium (STOOL SOFTENER PO), Take 1 capsule by mouth at bedtime., Disp: , Rfl:    ELDERBERRY PO, Take 2 each by mouth in the morning., Disp: , Rfl:    levothyroxine (SYNTHROID) 25 MCG tablet, Take 50 mcg by mouth every evening., Disp: ,  Rfl:    MAGNESIUM PO, Take 1 capsule by mouth in the morning and at bedtime., Disp: , Rfl:    metoprolol succinate (TOPROL-XL) 50 MG 24 hr tablet, Take 50 mg by mouth 2 (two) times daily., Disp: , Rfl:    Multiple Vitamins-Minerals (ADULT ONE DAILY GUMMIES PO), Take 2 tablets by mouth in the morning. Women's Multivitamin, Disp: , Rfl:    Omega-3 Fatty Acids (OMEGA 3 PO), Take 1 capsule by mouth daily with lunch. Super Omega 3, Disp: , Rfl:    omeprazole (PRILOSEC) 20 MG capsule, Take 20 mg by mouth daily before breakfast., Disp: , Rfl:    Probiotic Product (PROBIOTIC PO), Take 1 capsule by mouth in the morning., Disp: , Rfl:    TURMERIC PO, Take 900 mg by mouth in the morning and at bedtime., Disp: , Rfl:    Vibegron  75 MG TABS, Take 75 mg by mouth daily. (Patient taking differently: Take 3 tablets by mouth every 30 (thirty) days.), Disp: 90  tablet, Rfl: 3   OVER THE COUNTER MEDICATION, Take 2 capsules by mouth daily with lunch. [facility] MD-Bare Feet, Disp: , Rfl:    zinc sulfate, 50mg  elemental zinc, 220 (50 Zn) MG capsule, Take 220 mg by mouth in the morning., Disp: , Rfl:   "

## 2024-06-30 ENCOUNTER — Encounter

## 2024-10-31 ENCOUNTER — Inpatient Hospital Stay: Admitting: Oncology

## 2025-04-09 ENCOUNTER — Ambulatory Visit: Admitting: Urology
